# Patient Record
Sex: Female | Born: 1975 | Race: White | Hispanic: No | Smoking: Never smoker
Health system: Southern US, Community
[De-identification: ages and names within clinical notes are randomized; demographics above are authoritative.]

## PROBLEM LIST (undated history)

## (undated) DIAGNOSIS — F329 Major depressive disorder, single episode, unspecified: Secondary | ICD-10-CM

## (undated) DIAGNOSIS — G43909 Migraine, unspecified, not intractable, without status migrainosus: Secondary | ICD-10-CM

## (undated) DIAGNOSIS — K219 Gastro-esophageal reflux disease without esophagitis: Secondary | ICD-10-CM

## (undated) DIAGNOSIS — T7840XA Allergy, unspecified, initial encounter: Secondary | ICD-10-CM

## (undated) DIAGNOSIS — IMO0002 Reserved for concepts with insufficient information to code with codable children: Secondary | ICD-10-CM

## (undated) DIAGNOSIS — F32A Depression, unspecified: Secondary | ICD-10-CM

## (undated) DIAGNOSIS — Z8619 Personal history of other infectious and parasitic diseases: Secondary | ICD-10-CM

## (undated) HISTORY — DX: Depression, unspecified: F32.A

## (undated) HISTORY — DX: Migraine, unspecified, not intractable, without status migrainosus: G43.909

## (undated) HISTORY — DX: Gastro-esophageal reflux disease without esophagitis: K21.9

## (undated) HISTORY — DX: Personal history of other infectious and parasitic diseases: Z86.19

## (undated) HISTORY — DX: Allergy, unspecified, initial encounter: T78.40XA

## (undated) HISTORY — DX: Major depressive disorder, single episode, unspecified: F32.9

---

## 1997-10-03 DIAGNOSIS — R87619 Unspecified abnormal cytological findings in specimens from cervix uteri: Secondary | ICD-10-CM

## 1997-10-03 DIAGNOSIS — IMO0002 Reserved for concepts with insufficient information to code with codable children: Secondary | ICD-10-CM

## 1997-10-03 HISTORY — DX: Unspecified abnormal cytological findings in specimens from cervix uteri: R87.619

## 1997-10-03 HISTORY — DX: Reserved for concepts with insufficient information to code with codable children: IMO0002

## 2003-10-04 HISTORY — PX: DILATION AND CURETTAGE OF UTERUS: SHX78

## 2006-12-05 ENCOUNTER — Encounter: Payer: Self-pay | Admitting: Family Medicine

## 2006-12-05 ENCOUNTER — Ambulatory Visit: Payer: Self-pay | Admitting: Family Medicine

## 2006-12-26 ENCOUNTER — Ambulatory Visit (HOSPITAL_COMMUNITY): Admission: RE | Admit: 2006-12-26 | Discharge: 2006-12-26 | Payer: Self-pay | Admitting: Gynecology

## 2007-01-16 ENCOUNTER — Ambulatory Visit: Payer: Self-pay | Admitting: Family Medicine

## 2007-06-05 ENCOUNTER — Ambulatory Visit: Payer: Self-pay | Admitting: Family Medicine

## 2007-07-27 ENCOUNTER — Ambulatory Visit: Payer: Self-pay | Admitting: Family Medicine

## 2008-02-12 ENCOUNTER — Ambulatory Visit: Payer: Self-pay | Admitting: Family Medicine

## 2008-02-14 ENCOUNTER — Ambulatory Visit: Payer: Self-pay | Admitting: Gynecology

## 2008-02-26 ENCOUNTER — Ambulatory Visit: Payer: Self-pay | Admitting: Family Medicine

## 2008-02-28 ENCOUNTER — Ambulatory Visit (HOSPITAL_COMMUNITY): Admission: RE | Admit: 2008-02-28 | Discharge: 2008-02-28 | Payer: Self-pay | Admitting: Family Medicine

## 2008-04-08 ENCOUNTER — Encounter: Payer: Self-pay | Admitting: Obstetrics and Gynecology

## 2008-04-08 ENCOUNTER — Ambulatory Visit: Payer: Self-pay | Admitting: Obstetrics and Gynecology

## 2008-05-01 ENCOUNTER — Ambulatory Visit: Payer: Self-pay | Admitting: Gynecology

## 2008-05-16 ENCOUNTER — Ambulatory Visit (HOSPITAL_COMMUNITY): Admission: RE | Admit: 2008-05-16 | Discharge: 2008-05-16 | Payer: Self-pay | Admitting: Gynecology

## 2008-05-26 ENCOUNTER — Ambulatory Visit: Payer: Self-pay | Admitting: Obstetrics & Gynecology

## 2008-06-24 ENCOUNTER — Ambulatory Visit: Payer: Self-pay | Admitting: Obstetrics & Gynecology

## 2008-07-23 ENCOUNTER — Ambulatory Visit: Payer: Self-pay | Admitting: Obstetrics and Gynecology

## 2008-07-23 ENCOUNTER — Encounter: Payer: Self-pay | Admitting: Family Medicine

## 2008-07-23 LAB — CONVERTED CEMR LAB
Hemoglobin: 11.8 g/dL — ABNORMAL LOW (ref 12.0–15.0)
MCHC: 33.6 g/dL (ref 30.0–36.0)
MCV: 90.5 fL (ref 78.0–100.0)
Platelets: 200 10*3/uL (ref 150–400)
RDW: 13.7 % (ref 11.5–15.5)
WBC: 9.1 10*3/uL (ref 4.0–10.5)

## 2008-08-13 ENCOUNTER — Ambulatory Visit: Payer: Self-pay | Admitting: Obstetrics and Gynecology

## 2008-08-15 ENCOUNTER — Inpatient Hospital Stay (HOSPITAL_COMMUNITY): Admission: RE | Admit: 2008-08-15 | Discharge: 2008-08-15 | Payer: Self-pay | Admitting: Obstetrics & Gynecology

## 2008-08-15 ENCOUNTER — Ambulatory Visit: Payer: Self-pay | Admitting: Obstetrics and Gynecology

## 2008-08-18 ENCOUNTER — Ambulatory Visit: Payer: Self-pay | Admitting: Obstetrics and Gynecology

## 2008-08-18 ENCOUNTER — Encounter: Payer: Self-pay | Admitting: Family Medicine

## 2008-09-02 ENCOUNTER — Ambulatory Visit: Payer: Self-pay | Admitting: Family Medicine

## 2008-09-16 ENCOUNTER — Ambulatory Visit: Payer: Self-pay | Admitting: Obstetrics and Gynecology

## 2008-09-17 ENCOUNTER — Encounter: Payer: Self-pay | Admitting: Family Medicine

## 2008-09-23 ENCOUNTER — Ambulatory Visit: Payer: Self-pay | Admitting: Obstetrics & Gynecology

## 2008-09-30 ENCOUNTER — Ambulatory Visit: Payer: Self-pay | Admitting: Obstetrics and Gynecology

## 2008-10-07 ENCOUNTER — Ambulatory Visit: Payer: Self-pay | Admitting: Obstetrics and Gynecology

## 2008-10-09 ENCOUNTER — Ambulatory Visit: Payer: Self-pay | Admitting: Obstetrics & Gynecology

## 2008-10-12 ENCOUNTER — Ambulatory Visit: Payer: Self-pay | Admitting: Obstetrics and Gynecology

## 2008-10-12 ENCOUNTER — Inpatient Hospital Stay (HOSPITAL_COMMUNITY): Admission: AD | Admit: 2008-10-12 | Discharge: 2008-10-13 | Payer: Self-pay | Admitting: Obstetrics & Gynecology

## 2008-11-24 ENCOUNTER — Ambulatory Visit: Payer: Self-pay | Admitting: Obstetrics and Gynecology

## 2008-11-24 ENCOUNTER — Encounter: Payer: Self-pay | Admitting: Obstetrics and Gynecology

## 2008-11-24 ENCOUNTER — Encounter: Payer: Self-pay | Admitting: Family Medicine

## 2008-11-24 LAB — CONVERTED CEMR LAB
HCT: 39 % (ref 36.0–46.0)
MCHC: 33.3 g/dL (ref 30.0–36.0)
Platelets: 192 10*3/uL (ref 150–400)
RBC: 4.36 M/uL (ref 3.87–5.11)
WBC: 4.7 10*3/uL (ref 4.0–10.5)

## 2008-11-27 ENCOUNTER — Ambulatory Visit: Payer: Self-pay | Admitting: Family Medicine

## 2008-12-30 ENCOUNTER — Ambulatory Visit: Payer: Self-pay | Admitting: Obstetrics and Gynecology

## 2011-01-17 LAB — RPR: RPR Ser Ql: NONREACTIVE

## 2011-01-17 LAB — CBC
HCT: 38.7 % (ref 36.0–46.0)
Platelets: 176 10*3/uL (ref 150–400)
RBC: 4.16 MIL/uL (ref 3.87–5.11)
RDW: 14.1 % (ref 11.5–15.5)
WBC: 9.1 10*3/uL (ref 4.0–10.5)

## 2011-01-17 LAB — RH IMMUNE GLOB WKUP(>/=20WKS)(NOT WOMEN'S HOSP): Fetal Screen: NEGATIVE

## 2011-02-15 NOTE — Assessment & Plan Note (Signed)
Tricia, [name] NO.:  [id number]   MEDICAL RECORD NO.:  [id number]          PATIENT TYPE:  POB   LOCATION:  [facility] at [facility]         FACILITY:  [facility]   PHYSICIAN:  Argentina Donovan, MD        DATE OF BIRTH:  1976-02-26   DATE OF SERVICE:  12/30/2008                                  CLINIC NOTE   The patient comes in office today for a IUD check.  She had a Mirena IUD  inserted approximately 4 weeks ago on November 27, 2008, by Dr. Shawnie Pons.  She has not had any difficulty with her Mirena.  She has had some light  spotting.   PHYSICAL EXAMINATION:  GENERAL:  A well-developed, well-nourished 35-  year-old Caucasian female in no acute distress.  VITAL SIGNS:  Blood pressures is 85/58, pulse is 74, weight 136, height  is 5 feet 7.  GENITALIA:  Externally there are no lesions or discharges.  Internally  there is a very small amount of blood-colored discharge.  The cervical  os is without lesions.  The strings are present.  There is no cervical  motion tenderness or no adnexal mass.   ASSESSMENT:  Contraception.   PLAN:  Her IUD is in place and doing well.  She should not have any  difficulties with this.  If she does, she can certainly return to the  office on an as-needed basis.  Otherwise, we will see her back in March  2011 for her yearly exam.       Remonia Richter, NP    ______________________________  Argentina Donovan, MD    LR/MEDQ  D:  12/30/2008  T:  12/31/2008  Job:  [id number]

## 2011-02-15 NOTE — Assessment & Plan Note (Signed)
NAMENAHJAE, HOEG NO.:  [id number]   MEDICAL RECORD NO.:  [id number]          PATIENT TYPE:  POB   LOCATION:  [facility]         FACILITY:  [facility]   PHYSICIAN:  Tinnie Gens, MD        DATE OF BIRTH:  1976-02-05   DATE OF SERVICE:  11/27/2008                                  CLINIC NOTE   CHIEF COMPLAINT:  IUD insertion.   HISTORY OF PRESENT ILLNESS:  The patient is a 35 year old G1, P1 who is  status post vaginal delivery of a term infant who is doing well.  She is  in for postpartum check on November 24, 2008, and is back for an IUD  insertion today.  She has not had sex since her delivery.  She reports  her mood is well.  She is sleeping well.   On exam today, her vitals were as noted in the chart.  She is a well-  nourished female in no acute distress.  Soft, nontender, nondistended.   PROCEDURE:  Speculum was placed through the vagina.  Cervix was  visualized.  Painted with Betadine x2, grasped anteriorly with single-  tooth tenaculum, sound to  6.5 cm.  Mirena IUD was placed without  difficulty.  Strings were trimmed to 3 cm.   IMPRESSION:  Undesired fertility status post intrauterine device  insertion.   PLAN:  Follow up in 2-4 weeks for IUD check and string check.           ______________________________  Tinnie Gens, MD     TP/MEDQ  D:  11/27/2008  T:  11/28/2008  Job:  [id number]

## 2011-02-18 NOTE — Assessment & Plan Note (Signed)
NAMETOSHIBA, NULL NO.:  [id number]   MEDICAL RECORD NO.:  [id number]          PATIENT TYPE:  POB   LOCATION:  [facility]         FACILITY:  [facility]   PHYSICIAN:  Tinnie Gens, MD        DATE OF BIRTH:  14-May-1976   DATE OF SERVICE:  01/16/2007                                  CLINIC NOTE   CHIEF COMPLAINT:  Followup for infertility.   HISTORY OF PRESENT ILLNESS:  Patient is a 35 year old gravida 1, para 0-  0-1-0, had SAB for __________  approximately 3-1/2 years ago.  Since  that time she has had difficulty attaining pregnancy.  They have been  trying for the last year.  She and her husband come in today for  followup.  She does have normal cycles.  She brings in a __________  body temperature charting, which does confirm ovulation.  She also  underwent HSG testing, which showed a small endometrial cavity but not  abnormal and has no fixed filling defects.  Both tubes have a normal  appearance, and there is prompt free spill of contrast into both adnexal  regions.  Additionally, her husband underwent semen analysis, which  revealed normal pH, normal motility, normal volume, and total sperm  count of 589 million with 38% normal form, which appears normal to me.  The patient is not interested in infertility referral at this moment.  She will try again for the next six months and see how things are going  for her.  She may be ready for referral at that point.  The patient had  discussion with __________  regarding some shared cost __________  with  the repro __________  in [place] or referral to [facility].  The patient is  probably wanting to go with something short of __________  ovulation  plus IUI.   The patient will continue daily body temperature charting and timed  intercourse as best they can.  She will follow up in six months.           ______________________________  Tinnie Gens, MD     TP/MEDQ  D:  01/16/2007  T:  01/16/2007  Job:   [id number]

## 2011-02-18 NOTE — Assessment & Plan Note (Signed)
NAME:  Tricia [name], Tricia [name] NO.:  [id number]   MEDICAL RECORD NO.:  [id number]          PATIENT TYPE:  POB   LOCATION:  [facility]         FACILITY:  [facility]   PHYSICIAN:  Tinnie Gens, MD        DATE OF BIRTH:  [birth year]   DATE OF SERVICE:                                  CLINIC NOTE   CHIEF COMPLAINT:  Yearly exam.   HISTORY OF PRESENT ILLNESS:  Patient is a 35 year old, gravida 1, para 0-  0-1-0 who had a SAB 3-1/2 years ago with __________ in [place] and a  D&C following this. Since that time she has gotten married and would  like to achieve pregnancy. She has been trying for approximately 1 year.  Her husband has no children. She has regular cycles that are monthly.  She has been on prenatal vitamins and folic acid all this time. Her  cycles are approximately 25 to 27 days and are light in flow. Otherwise  she is without complaint.   PAST MEDICAL HISTORY:  Negative except for migraine headaches.   PAST SURGICAL HISTORY:  D&C.   MEDICATIONS:  1. Prenatal vitamins 1 p.o. daily.  2. Folic acid 1 p.o. daily.   ALLERGIES:  TO SULFA.   GYNECOLOGICAL HISTORY:  Menarche at age 6, cycles every 25 to 27 days.  No pain with her periods, no history of STD, gonorrhea, Chlamydia, or  PID. She does have a history of abnormal pap in 2000 that was followed  with a repeat pap. She has had no surgery on her cervix. OB history is  G1, P0 with 1 SAB at 10 weeks with __________ diagnosed approximately 3-  1/2 years ago.   FAMILY HISTORY:  Hypertension. She has rheumatoid arthritis in her  sister.   SOCIAL HISTORY:  She is a Runner, broadcasting/film/video for third grade in [employer],  no tobacco. She drinks approximately 2 alcoholic beverages per week. No  other drugs. A 14-point review of systems is reviewed, please see  __________ in the chart.   PHYSICAL EXAMINATION:  VITAL SIGNS:  Her weight is 136, blood pressure  97/60.  GENERAL:  She is a well-developed,  well-nourished female in no acute  distress.  LUNGS: Clear bilaterally.  CV: Regular rate and rhythm, no rubs, gallops, or murmurs.  NECK: Supple, normal thyroid.  BREAST: Symmetric with everted nipples, no masses, no supraclavicular,  axillary adenopathy.  ABDOMEN: Soft, nontender, nondistended.  EXTREMITIES: No cyanosis, clubbing, or edema, 2+ distal pulses.  GU: Normal external female genitalia, BUS is normal, vagina is pink and  rugae.  Cervix normal appearance and without lesion.  Uterus small  anteverted, located to the patient's left, nontender, no adnexal mass,  or tenderness.   IMPRESSION:  1. Yearly exam.  2. Infertility.   PLAN:  1. Pap smear today.  2. Reviewed self breast exams.  3. Offered the patient flu shot but she declined.  4. The 3 main causes of infertility including no ovulation unlikely      given regular cycles, blockage of tube or ovary unlikely.  Given      patient's history, could be some mild Asherman's.  Will  check HSG      and female problem.  Will check semen analysis.  5. Will follow up in 6-8 weeks to see results of test and further      referrals as indicated.           ______________________________  Tinnie Gens, MD     TP/MEDQ  D:  12/05/2006  T:  12/05/2006  Job:  [id number]

## 2011-07-05 LAB — URINALYSIS, ROUTINE W REFLEX MICROSCOPIC
Nitrite: NEGATIVE
Urobilinogen, UA: 0.2

## 2011-08-10 LAB — OB RESULTS CONSOLE ANTIBODY SCREEN: Antibody Screen: NEGATIVE

## 2011-08-10 LAB — OB RESULTS CONSOLE ABO/RH: RH Type: NEGATIVE

## 2011-09-06 LAB — OB RESULTS CONSOLE GC/CHLAMYDIA
Chlamydia: NEGATIVE
Gonorrhea: NEGATIVE

## 2011-10-04 NOTE — L&D Delivery Note (Signed)
Delivery Note At 12:20 PM a viable and healthy female was delivered via  (Presentation:LOA with [place] x one reduced ).  APGAR: 8,9 ; weight  pending.   Placenta status:  Spontaneous and intact with 3 vessel Cord:  with the following complications: none.  Cord pH: na  Anesthesia:  epidural Episiotomy: none Lacerations: none Suture Repair: none Est. Blood Loss (mL): 300  Mom to postpartum.  Baby to nursery-stable.  Tricia [name] 03/05/2012, 12:31 PM

## 2012-02-15 LAB — OB RESULTS CONSOLE GBS: GBS: NEGATIVE

## 2012-03-04 ENCOUNTER — Inpatient Hospital Stay (HOSPITAL_COMMUNITY)
Admission: AD | Admit: 2012-03-04 | Discharge: 2012-03-06 | DRG: 373 | Disposition: A | Discharge status: Home / Self Care | Payer: BC Managed Care – PPO | Source: Ambulatory Visit | Attending: Obstetrics and Gynecology | Admitting: Obstetrics and Gynecology

## 2012-03-04 ENCOUNTER — Encounter (HOSPITAL_COMMUNITY): Payer: Self-pay | Admitting: Obstetrics and Gynecology

## 2012-03-04 DIAGNOSIS — D62 Acute posthemorrhagic anemia: Secondary | ICD-10-CM | POA: Diagnosis not present

## 2012-03-04 DIAGNOSIS — O09529 Supervision of elderly multigravida, unspecified trimester: Secondary | ICD-10-CM | POA: Diagnosis present

## 2012-03-04 DIAGNOSIS — O9903 Anemia complicating the puerperium: Principal | ICD-10-CM | POA: Diagnosis not present

## 2012-03-04 HISTORY — DX: Migraine, unspecified, not intractable, without status migrainosus: G43.909

## 2012-03-04 HISTORY — DX: Reserved for concepts with insufficient information to code with codable children: IMO0002

## 2012-03-04 LAB — CBC
Hemoglobin: 10.5 g/dL — ABNORMAL LOW (ref 12.0–15.0)
MCH: 28.8 pg (ref 26.0–34.0)
MCHC: 33.2 g/dL (ref 30.0–36.0)
Platelets: 170 10*3/uL (ref 150–400)
RDW: 14.3 % (ref 11.5–15.5)

## 2012-03-04 MED ORDER — ACETAMINOPHEN 325 MG PO TABS: 650.0000 mg | ORAL_TABLET | ORAL | Status: DC | PRN

## 2012-03-04 MED ORDER — CITRIC ACID-SODIUM CITRATE 334-500 MG/5ML PO SOLN: 30.0000 mL | ORAL | Status: DC | PRN

## 2012-03-04 MED ORDER — LACTATED RINGERS IV SOLN
INTRAVENOUS | Status: DC
  Administered 2012-03-04 – 2012-03-05 (×2): via INTRAVENOUS

## 2012-03-04 MED ORDER — OXYCODONE-ACETAMINOPHEN 5-325 MG PO TABS: 1.0000 | ORAL_TABLET | ORAL | Status: DC | PRN

## 2012-03-04 MED ORDER — OXYTOCIN BOLUS FROM INFUSION
500.0000 mL | Freq: Once | INTRAVENOUS | Status: DC
  Filled 2012-03-04: qty 500

## 2012-03-04 MED ORDER — FLEET ENEMA 7-19 GM/118ML RE ENEM: 1.0000 | ENEMA | RECTAL | Status: DC | PRN

## 2012-03-04 MED ORDER — OXYTOCIN 20 UNITS IN LACTATED RINGERS INFUSION - SIMPLE
125.0000 mL/h | Freq: Once | INTRAVENOUS | Status: AC
  Administered 2012-03-05: 125 mL/h via INTRAVENOUS

## 2012-03-04 MED ORDER — ZOLPIDEM TARTRATE 5 MG PO TABS
5.0000 mg | ORAL_TABLET | Freq: Every evening | ORAL | Status: DC | PRN
  Administered 2012-03-04: 5 mg via ORAL
  Filled 2012-03-04: qty 1

## 2012-03-04 MED ORDER — LIDOCAINE HCL (PF) 1 % IJ SOLN
30.0000 mL | INTRAMUSCULAR | Status: DC | PRN
  Filled 2012-03-04: qty 30

## 2012-03-04 MED ORDER — LACTATED RINGERS IV SOLN
500.0000 mL | INTRAVENOUS | Status: DC | PRN
  Administered 2012-03-05: 500 mL via INTRAVENOUS

## 2012-03-04 MED ORDER — ONDANSETRON HCL 4 MG/2ML IJ SOLN: 4.0000 mg | Freq: Four times a day (QID) | INTRAMUSCULAR | Status: DC | PRN

## 2012-03-04 MED ORDER — IBUPROFEN 600 MG PO TABS: 600.0000 mg | ORAL_TABLET | Freq: Four times a day (QID) | ORAL | Status: DC | PRN

## 2012-03-04 NOTE — H&P (Signed)
[name], Tricia [name] NO.:  [id number]  MEDICAL RECORD NO.:  [id number]  LOCATION:  9164                          FACILITY:  [facility]  PHYSICIAN:  Lenoard Aden, M.D.DATE OF BIRTH:  Jun 22, 1976  DATE OF ADMISSION:  03/04/2012 DATE OF DISCHARGE:                             HISTORY & PHYSICAL   CHIEF COMPLAINT: Rupture of  membranes at 6 p.m.  She is a 36 year old white female, G3, P1 at 38-1/7th week gestation who presents with leakage of clear fluid at 6pm and irregular contractions, GBS negative, for re-evaluation and admission due to spontaneous rupture of membranes.  She has allergies to SULFA.  MEDICATIONS:  Prenatal vitamins and Claritin as needed.  She is a nonsmoker, nondrinker.  She denies domestic or physical violence.  41 week vaginal delivery in 2010, SAB in 2005.  FAMILY HISTORY:  Arthritis and chronic hypertension.  SOCIAL HISTORY:  Noncontributory.  PREVIOUS MEDICAL HISTORY:  Noncontributory.  PRENATAL COURSE:  Uncomplicated.  PHYSICAL EXAMINATION:  GENERAL:  She is a well-developed, well- nourished, white female, in no acute distress. HEENT:  Normal. NECK:  Supple.  Full range of motion. LUNGS:  Clear. HEART:  Regular rhythm. ABDOMEN:  Soft, gravid, and nontender.  Estimated fetal weight 6-1/2 to 7 pounds. SKIN:  Intact.  NST is reactive.   IMP: Spontaneous Rupture of membranes at term.  GBS negative.  PLAN:  Admit.  Expectant management.  Pitocin as needed.  Anticipate cautious attempts at vaginal delivery.     Lenoard Aden, M.D.     RJT/MEDQ  D:  03/04/2012  T:  03/04/2012  Job:  [id number]

## 2012-03-04 NOTE — MAU Note (Signed)
"  Around 1400 today I had a small leak.  Then another one small one around 1600.  Then a third bigger gush around 1830 that continued to leak.  Now everytime I change positions or move to walk, I leak more fluid.  The fluid was clear.  (+) FM, (-) GBS, no GDM or Hypertension."

## 2012-03-04 NOTE — Progress Notes (Signed)
MD stated that he wants to see if patient goes into labor.  If not will start pitocin in the morning.

## 2012-03-04 NOTE — Progress Notes (Signed)
Tricia [name] is a 36 y.o. G3P1011 at [redacted]w[redacted]d by LMP admitted for rupture of membranes  Subjective: Comfortable  Objective: Ht 5\' 7"  (1.702 m)  Wt 79.833 kg (176 lb)  BMI 27.57 kg/m2      FHT:  FHR: 145 bpm, variability: moderate,  accelerations:  Present,  decelerations:  Absent UC:   irregular, every 4-8 minutes SVE:   Dilation: 2.5 Effacement (%): 80;70 Station: -2 Exam by:: Raelyn Mora, RN Clear AF noted  Labs: pending  Assessment / Plan: SROM at term, GBS neg  Labor: Progressing normally Preeclampsia:  na Fetal Wellbeing:  Category I Pain Control:  Labor support without medications I/D:  n/a Anticipated MOD:  NSVD  [name] J 03/04/2012, 9:08 PM

## 2012-03-05 ENCOUNTER — Encounter (HOSPITAL_COMMUNITY): Payer: Self-pay | Admitting: Anesthesiology

## 2012-03-05 ENCOUNTER — Encounter (HOSPITAL_COMMUNITY): Payer: Self-pay | Admitting: *Deleted

## 2012-03-05 ENCOUNTER — Inpatient Hospital Stay (HOSPITAL_COMMUNITY): Payer: BC Managed Care – PPO | Admitting: Anesthesiology

## 2012-03-05 LAB — RPR: RPR Ser Ql: NONREACTIVE

## 2012-03-05 MED ORDER — ONDANSETRON HCL 4 MG PO TABS: 4.0000 mg | ORAL_TABLET | ORAL | Status: DC | PRN

## 2012-03-05 MED ORDER — DIPHENHYDRAMINE HCL 50 MG/ML IJ SOLN: 12.5000 mg | INTRAMUSCULAR | Status: DC | PRN

## 2012-03-05 MED ORDER — METHYLERGONOVINE MALEATE 0.2 MG/ML IJ SOLN: 0.2000 mg | INTRAMUSCULAR | Status: DC | PRN

## 2012-03-05 MED ORDER — EPHEDRINE 5 MG/ML INJ: 10.0000 mg | INTRAVENOUS | Status: DC | PRN

## 2012-03-05 MED ORDER — PHENYLEPHRINE 40 MCG/ML (10ML) SYRINGE FOR IV PUSH (FOR BLOOD PRESSURE SUPPORT)
80.0000 ug | PREFILLED_SYRINGE | INTRAVENOUS | Status: DC | PRN
  Filled 2012-03-05: qty 5

## 2012-03-05 MED ORDER — TETANUS-DIPHTH-ACELL PERTUSSIS 5-2.5-18.5 LF-MCG/0.5 IM SUSP: 0.5000 mL | Freq: Once | INTRAMUSCULAR | Status: DC

## 2012-03-05 MED ORDER — FENTANYL 2.5 MCG/ML BUPIVACAINE 1/10 % EPIDURAL INFUSION (WH - ANES)
INTRAMUSCULAR | Status: DC | PRN
  Administered 2012-03-05: 14 mL/h via EPIDURAL

## 2012-03-05 MED ORDER — EPHEDRINE 5 MG/ML INJ
10.0000 mg | INTRAVENOUS | Status: DC | PRN
  Filled 2012-03-05: qty 4

## 2012-03-05 MED ORDER — PHENYLEPHRINE 40 MCG/ML (10ML) SYRINGE FOR IV PUSH (FOR BLOOD PRESSURE SUPPORT): 80.0000 ug | PREFILLED_SYRINGE | INTRAVENOUS | Status: DC | PRN

## 2012-03-05 MED ORDER — BENZOCAINE-MENTHOL 20-0.5 % EX AERO: 1.0000 "application " | INHALATION_SPRAY | CUTANEOUS | Status: DC | PRN

## 2012-03-05 MED ORDER — LIDOCAINE HCL (PF) 1 % IJ SOLN
INTRAMUSCULAR | Status: DC | PRN
  Administered 2012-03-05: 4 mL

## 2012-03-05 MED ORDER — TERBUTALINE SULFATE 1 MG/ML IJ SOLN: 0.2500 mg | Freq: Once | INTRAMUSCULAR | Status: DC | PRN

## 2012-03-05 MED ORDER — DIBUCAINE 1 % RE OINT: 1.0000 "application " | TOPICAL_OINTMENT | RECTAL | Status: DC | PRN

## 2012-03-05 MED ORDER — OXYTOCIN 20 UNITS IN LACTATED RINGERS INFUSION - SIMPLE: 125.0000 mL/h | INTRAVENOUS | Status: DC

## 2012-03-05 MED ORDER — LANOLIN HYDROUS EX OINT: TOPICAL_OINTMENT | CUTANEOUS | Status: DC | PRN

## 2012-03-05 MED ORDER — OXYCODONE-ACETAMINOPHEN 5-325 MG PO TABS: 1.0000 | ORAL_TABLET | ORAL | Status: DC | PRN

## 2012-03-05 MED ORDER — IBUPROFEN 600 MG PO TABS
600.0000 mg | ORAL_TABLET | Freq: Four times a day (QID) | ORAL | Status: DC
  Administered 2012-03-05 – 2012-03-06 (×4): 600 mg via ORAL
  Filled 2012-03-05 (×4): qty 1

## 2012-03-05 MED ORDER — PRENATAL MULTIVITAMIN CH
1.0000 | ORAL_TABLET | Freq: Every day | ORAL | Status: DC
  Administered 2012-03-06: 1 via ORAL
  Filled 2012-03-05: qty 1

## 2012-03-05 MED ORDER — SIMETHICONE 80 MG PO CHEW: 80.0000 mg | CHEWABLE_TABLET | ORAL | Status: DC | PRN

## 2012-03-05 MED ORDER — LACTATED RINGERS IV SOLN
500.0000 mL | Freq: Once | INTRAVENOUS | Status: AC
  Administered 2012-03-05: 500 mL via INTRAVENOUS

## 2012-03-05 MED ORDER — SENNOSIDES-DOCUSATE SODIUM 8.6-50 MG PO TABS
2.0000 | ORAL_TABLET | Freq: Every day | ORAL | Status: DC
  Administered 2012-03-05: 2 via ORAL

## 2012-03-05 MED ORDER — ONDANSETRON HCL 4 MG/2ML IJ SOLN: 4.0000 mg | INTRAMUSCULAR | Status: DC | PRN

## 2012-03-05 MED ORDER — ZOLPIDEM TARTRATE 5 MG PO TABS: 5.0000 mg | ORAL_TABLET | Freq: Every evening | ORAL | Status: DC | PRN

## 2012-03-05 MED ORDER — DIPHENHYDRAMINE HCL 25 MG PO CAPS: 25.0000 mg | ORAL_CAPSULE | Freq: Four times a day (QID) | ORAL | Status: DC | PRN

## 2012-03-05 MED ORDER — OXYTOCIN 20 UNITS IN LACTATED RINGERS INFUSION - SIMPLE
1.0000 m[IU]/min | INTRAVENOUS | Status: DC
  Administered 2012-03-05: 2 m[IU]/min via INTRAVENOUS
  Filled 2012-03-05: qty 1000

## 2012-03-05 MED ORDER — METHYLERGONOVINE MALEATE 0.2 MG PO TABS: 0.2000 mg | ORAL_TABLET | ORAL | Status: DC | PRN

## 2012-03-05 MED ORDER — FENTANYL 2.5 MCG/ML BUPIVACAINE 1/10 % EPIDURAL INFUSION (WH - ANES)
14.0000 mL/h | INTRAMUSCULAR | Status: DC
  Filled 2012-03-05: qty 60

## 2012-03-05 MED ORDER — WITCH HAZEL-GLYCERIN EX PADS: 1.0000 "application " | MEDICATED_PAD | CUTANEOUS | Status: DC | PRN

## 2012-03-05 NOTE — Progress Notes (Signed)
Tricia [name] is a 36 y.o. G3P1011 at 106w2d by LMP admitted for rupture of membranes  Subjective: comfortable  Objective: BP 115/65  Pulse 80  Temp(Src) 97.9 F (36.6 C) (Oral)  Resp 18  Ht 5\' 7"  (1.702 m)  Wt 79.833 kg (176 lb)  BMI 27.57 kg/m2      FHT:  FHR: 155 bpm, variability: moderate,  accelerations:  Present,  decelerations:  Absent UC:   irregular, every 4-6 minutes SVE:   Dilation: 2.5 Effacement (%): 70 Station: -2 Exam by:: H.Norton, RN   Labs: Lab Results  Component Value Date   WBC 9.5 03/04/2012   HGB 10.5* 03/04/2012   HCT 31.6* 03/04/2012   MCV 86.6 03/04/2012   PLT 170 03/04/2012    Assessment / Plan: Augmentation of labor, progressing well  Labor: Progressing normally Preeclampsia:  na Fetal Wellbeing:  Category I Pain Control:  Labor support without medications I/D:  n/a Anticipated MOD:  NSVD  Tricia [name] 03/05/2012, 7:03 AM

## 2012-03-05 NOTE — Anesthesia Preprocedure Evaluation (Signed)

## 2012-03-05 NOTE — Anesthesia Procedure Notes (Signed)
Epidural Patient location during procedure: OB Start time: 03/05/2012 10:02 AM  Staffing Anesthesiologist: Kaysey Berndt A. Performed by: anesthesiologist   Preanesthetic Checklist Completed: patient identified, site marked, surgical consent, pre-op evaluation, timeout performed, IV checked, risks and benefits discussed and monitors and equipment checked  Epidural Patient position: sitting Prep: site prepped and draped and DuraPrep Patient monitoring: continuous pulse ox and blood pressure Approach: midline Injection technique: LOR air  Needle:  Needle type: Tuohy  Needle gauge: 17 G Needle length: 9 cm Needle insertion depth: 5 cm cm Catheter type: closed end flexible Catheter size: 19 Gauge Catheter at skin depth: 10 cm Test dose: negative and Other  Assessment Events: blood not aspirated, injection not painful, no injection resistance, negative IV test and no paresthesia  Additional Notes Patient identified. Risks and benefits discussed including failed block, incomplete  Pain control, post dural puncture headache, nerve damage, paralysis, blood pressure Changes, nausea, vomiting, reactions to medications-both toxic and allergic and post Partum back pain. All questions were answered. Patient expressed understanding and wished to proceed. Sterile technique was used throughout procedure. Epidural site was Dressed with sterile barrier dressing. No paresthesias, signs of intravascular injection Or signs of intrathecal spread were encountered.  Patient was more comfortable after the epidural was dosed. Please see RN's note for documentation of vital signs and FHR which are stable.

## 2012-03-06 ENCOUNTER — Encounter (HOSPITAL_COMMUNITY): Payer: Self-pay | Admitting: Obstetrics and Gynecology

## 2012-03-06 LAB — CBC
HCT: 29.5 % — ABNORMAL LOW (ref 36.0–46.0)
MCHC: 32.2 g/dL (ref 30.0–36.0)
MCV: 88.3 fL (ref 78.0–100.0)
RDW: 14.3 % (ref 11.5–15.5)

## 2012-03-06 MED ORDER — RHO D IMMUNE GLOBULIN 1500 UNIT/2ML IJ SOLN
300.0000 ug | Freq: Once | INTRAMUSCULAR | Status: AC
  Administered 2012-03-06: 300 ug via INTRAMUSCULAR
  Filled 2012-03-06: qty 2

## 2012-03-06 MED ORDER — IBUPROFEN 600 MG PO TABS: 600.0000 mg | ORAL_TABLET | Freq: Four times a day (QID) | ORAL | Status: AC

## 2012-03-06 MED ORDER — DOCUSATE SODIUM 100 MG PO CAPS: 100.0000 mg | ORAL_CAPSULE | Freq: Two times a day (BID) | ORAL | Status: AC | PRN

## 2012-03-06 MED ORDER — FERRALET 90 90-1 MG PO TABS: 1.0000 | ORAL_TABLET | Freq: Every day | ORAL | Status: DC

## 2012-03-06 NOTE — Discharge Summary (Signed)
Obstetric Discharge Summary Reason for Admission: rupture of membranes and term gestation, labor augmentation Prenatal Procedures: ultrasound Intrapartum Procedures: spontaneous vaginal delivery Postpartum Procedures: Rho(D) Ig Complications-Operative and Postpartum: none Hemoglobin  Date Value Range Status  03/06/2012 9.5* 12.0-15.0 (g/dL) Final     HCT  Date Value Range Status  03/06/2012 29.5* 36.0-46.0 (%) Final    Physical Exam:  General: alert, cooperative and no distress Lochia: appropriate Uterine Fundus: firm Incision: n/a DVT Evaluation: No evidence of DVT seen on physical exam.  Discharge Diagnoses: Term Pregnancy-delivered and Mild ABL Anemia  Discharge Information: Date: 03/06/2012 Activity: pelvic rest Diet: routine Medications: Ibuprofen, Colace and Iron Condition: stable Instructions: refer to practice specific booklet Discharge to: home   Newborn Data: Live born female on 03/05/12 Birth Weight: 6 lb 15.1 oz (3150 g) APGAR: 8, 9  Home with mother.  Emmett Bracknell K 03/06/2012, 9:50 AM

## 2012-03-06 NOTE — Plan of Care (Signed)
Problem: Discharge Progression Outcomes Goal: MMR given as ordered Outcome: Not Met (add Reason) Not ordered

## 2012-03-06 NOTE — Progress Notes (Signed)
Patient ID: Tricia [name], female   DOB: 13-May-1976, 36 y.o.   MRN: [id number]  PPD # 1  Subjective: Pt reports feeling well and eager for early d/c home today/ Pain controlled with prescription NSAID's including ibuprofen Tolerating po/ Voiding without problems/ No n/v Bleeding is moderate Newborn info:  Information for the patient's newborn:  Ardene, Remley [[id number]]  female  / circ today/ Feeding: breast   Objective:  VS: Blood pressure 97/62, pulse 89, temperature 97.9 F (36.6 C), temperature source Oral, resp. rate 18    Basename 03/06/12 0535 03/04/12 2114  WBC 10.5 9.5  HGB 9.5* 10.5*  HCT 29.5* 31.6*  PLT 156 170    Blood type: A/Negative/-- (11/07 0000) (Infant Rh Pos) Rubella: Equivocal (11/07 0000)    Physical Exam:  General: A & O x 3  alert, cooperative and no distress CV: Regular rate and rhythm Resp: clear Abdomen: soft, nontender, normal bowel sounds Uterine Fundus: firm, below umbilicus, nontender Perineum: intact and mild erythema; ice pack in place Lochia: moderate Ext: edema +1 to +2 pedal and pretib and Homans sign is negative, no sign of DVT   A/P: PPD # 1/ G3P2012/ S/P:spontaneous vaginal delivery ABL Anemia Stable for early d/c home if peds clears infant RX: Ibuprofen 600mg  po Q 6 hrs prn pain #30 Refill x 1 Ferralet 90 po QD #30 Ref x 1 Colace 100mg  1 to 2 po QD prn #30 Ref x 1 Rt pp visit in 6 weeks Rhogam before d/c   Demetrius Revel, MSN, [facility] 03/06/2012, 9:08 AM

## 2012-03-07 LAB — RH IG WORKUP (INCLUDES ABO/RH)
ABO/RH(D): A NEG
DAT, IgG: NEGATIVE
Unit division: 0

## 2012-03-07 NOTE — Anesthesia Postprocedure Evaluation (Signed)
  Anesthesia Post-op Note  Patient: Tricia [name]  Procedure(s) Performed: *Lumbar epidural for l&d *  Complications: No apparent anesthesia complications

## 2014-08-04 ENCOUNTER — Encounter (HOSPITAL_COMMUNITY): Payer: Self-pay | Admitting: Obstetrics and Gynecology

## 2015-01-01 ENCOUNTER — Emergency Department
Admit: 2015-01-01 | Disposition: A | Discharge status: Home / Self Care | Payer: Self-pay | Admitting: Emergency Medicine

## 2018-07-18 LAB — HM MAMMOGRAPHY

## 2018-07-18 LAB — HM PAP SMEAR: HM Pap smear: NEGATIVE

## 2018-11-13 ENCOUNTER — Encounter: Payer: Self-pay | Admitting: Podiatry

## 2018-11-13 ENCOUNTER — Ambulatory Visit: Payer: BC Managed Care – PPO | Admitting: Podiatry

## 2018-11-13 ENCOUNTER — Ambulatory Visit (INDEPENDENT_AMBULATORY_CARE_PROVIDER_SITE_OTHER): Payer: BC Managed Care – PPO

## 2018-11-13 VITALS — BP 117/80 | HR 67

## 2018-11-13 DIAGNOSIS — M2580 Other specified joint disorders, unspecified joint: Secondary | ICD-10-CM | POA: Diagnosis not present

## 2018-11-13 DIAGNOSIS — M2012 Hallux valgus (acquired), left foot: Secondary | ICD-10-CM

## 2018-11-13 MED ORDER — MELOXICAM 15 MG PO TABS: 15.0000 mg | ORAL_TABLET | Freq: Every day | ORAL | 1 refills | Status: AC

## 2018-11-18 NOTE — Progress Notes (Signed)
   Subjective: 43 year old female presenting today as a new patient with a chief complaint of pain to the medial left hallux that has been ongoing for the past few weeks. Wearing shoes and walking excessively increases the pain. She has not done anything for treatment. Patient is here for further evaluation and treatment.  Past Medical History:  Diagnosis Date  . Abnormal Pap smear 1999  . Migraines   . PP SVD 03/05/12 M 03/06/2012      Objective: Physical Exam General: The patient is alert and oriented x3 in no acute distress.  Dermatology: Skin is cool, dry and supple bilateral lower extremities. Negative for open lesions or macerations.  Vascular: Palpable pedal pulses bilaterally. No edema or erythema noted. Capillary refill within normal limits.  Neurological: Epicritic and protective threshold grossly intact bilaterally.   Musculoskeletal Exam: Clinical evidence of bunion deformity noted to the respective foot. There is moderate pain on palpation range of motion of the first MPJ. Lateral deviation of the hallux noted consistent with hallux abductovalgus. Pain with palpation to the left fibular sesamoid apparatus.  Radiographic Exam: Increased intermetatarsal angle greater than 15 with a hallux abductus angle greater than 30 noted on AP view. Moderate degenerative changes noted within the first MPJ.  Assessment: 1. 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 Patient was evaluated. X-Rays reviewed. 2. Declined injections.  3. Prescription for Meloxicam provided to patient. 4. Offloading Dancer's pads dispensed.  5. Return to clinic as needed.   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    Edrick Kins, DPM [facility]  Dr. Edrick Kins, [address]                                        [place], [place] [address]                Office [phone]  Fax [phone]

## 2018-11-27 ENCOUNTER — Ambulatory Visit: Payer: BC Managed Care – PPO | Admitting: Family Medicine

## 2018-11-27 ENCOUNTER — Encounter: Payer: Self-pay | Admitting: Family Medicine

## 2018-11-27 VITALS — BP 100/62 | HR 62 | Temp 98.0°F | Ht 65.75 in | Wt 147.5 lb

## 2018-11-27 DIAGNOSIS — R1901 Right upper quadrant abdominal swelling, mass and lump: Secondary | ICD-10-CM | POA: Insufficient documentation

## 2018-11-27 DIAGNOSIS — J309 Allergic rhinitis, unspecified: Secondary | ICD-10-CM | POA: Diagnosis not present

## 2018-11-27 DIAGNOSIS — F324 Major depressive disorder, single episode, in partial remission: Secondary | ICD-10-CM | POA: Diagnosis not present

## 2018-11-27 DIAGNOSIS — G43109 Migraine with aura, not intractable, without status migrainosus: Secondary | ICD-10-CM

## 2018-11-27 DIAGNOSIS — F339 Major depressive disorder, recurrent, unspecified: Secondary | ICD-10-CM | POA: Insufficient documentation

## 2018-11-27 DIAGNOSIS — G43909 Migraine, unspecified, not intractable, without status migrainosus: Secondary | ICD-10-CM | POA: Insufficient documentation

## 2018-11-27 MED ORDER — FLUTICASONE PROPIONATE 50 MCG/ACT NA SUSP: 1.0000 | Freq: Every day | NASAL | 1 refills | Status: DC

## 2018-11-27 MED ORDER — ESCITALOPRAM OXALATE 10 MG PO TABS: 10.0000 mg | ORAL_TABLET | Freq: Every day | ORAL | 2 refills | Status: DC

## 2018-11-27 NOTE — Patient Instructions (Addendum)
#  Depression  - increase the Lexapro to 10 mg daily - return in 6 weeks  #Migraines - Try to keep track of how often  - return if you want to discuss preventative medication  #Mass - we will start with an ultrasound to get a better look at what this could be

## 2018-11-27 NOTE — Assessment & Plan Note (Signed)
Etiology unclear. Seems superficial to the muscle layer but not entirely clear. Small and long in size. [place] to further evaluate

## 2018-11-27 NOTE — Assessment & Plan Note (Addendum)
OTC medication currently. Did not tolerate tryptan 2/2 to excessive sleepiness. Could consider topiramate if recurrent.

## 2018-11-27 NOTE — Assessment & Plan Note (Signed)
Having a difficult year. Overall improved since starting escitalopram but is doing worse. Interested in increasing dose which is appropriate.

## 2018-11-27 NOTE — Progress Notes (Signed)
Subjective:     Tricia [name] is a 43 y.o. female presenting for Establish Care (has seen Dr. Bufford Lope. Has not had a PCP in years.) and Mass (x 1 year. Located on the right side bottom of rib cage. Has gotten bigger. Some soreness in the last 2 weeks.)     HPI   #Mass - noticed it 1 year ago - the right side of the rib cage - increasing in size - sore over the last 2 weeks - whole area - firm - difficulty getting comfortable on that side 2 nights ago - no redness or skin changes - not worse with movement - at least doubled in size since first noticing it - feels like something is stuck in her rib cage  #depression - this year has been hard - lexapro currently x 2.5 years - currently in therapy  - on a low dose - did get to good place with starting medicatoin  #migraines - increasing - using over the counter migraine medication - aleve is not improving - works through - was on Imitrex most recently - but this puts her to sleep - diagnosed as child - 4 years - shifted with hormones - no change with eating  #birth control - mirena - about 2-3 years  - no impact on migraines  Review of Systems  Constitutional: Negative for chills and fever.  Gastrointestinal: Negative for abdominal pain, nausea and vomiting.     Social History   Tobacco Use  Smoking Status Never Smoker  Smokeless Tobacco Never Used        Objective:    BP Readings from Last 3 Encounters:  11/27/18 100/62  11/13/18 117/80  03/06/12 97/62   Wt Readings from Last 3 Encounters:  11/27/18 147 lb 8 oz (66.9 kg)  03/04/12 176 lb (79.8 kg)    BP 100/62   Pulse 62   Temp 98 F (36.7 C)   Ht 5' 5.75" (1.67 m)   Wt 147 lb 8 oz (66.9 kg)   BMI 23.99 kg/m    Physical Exam Constitutional:      General: She is not in acute distress.    Appearance: She is well-developed. She is not diaphoretic.  HENT:     Right Ear: External ear normal.     Left Ear: External ear normal.   Nose: Nose normal.  Eyes:     Conjunctiva/sclera: Conjunctivae normal.  Neck:     Musculoskeletal: Neck supple.  Cardiovascular:     Rate and Rhythm: Normal rate and regular rhythm.     Heart sounds: No murmur.  Pulmonary:     Effort: Pulmonary effort is normal. No respiratory distress.     Breath sounds: Normal breath sounds. No wheezing.  Abdominal:     General: Abdomen is flat. Bowel sounds are normal. There is no distension.     Palpations: Abdomen is soft.     Tenderness: There is no abdominal tenderness.     Comments: RUQ just distal to the last rib is a small, firm mass. Immobile but feels below the surface of the skin and possibly in the muscle layer. It is irregularly shaped with a small circular top but then extending longer to the lateral abdomen  Skin:    General: Skin is warm and dry.     Capillary Refill: Capillary refill takes less than 2 seconds.  Neurological:     Mental Status: She is alert. Mental status is at baseline.  Psychiatric:  Mood and Affect: Mood normal.        Behavior: Behavior normal.      Depression screen [facility] 2/9 11/27/2018  Decreased Interest 0  Down, Depressed, Hopeless 0  PHQ - 2 Score 0  Altered sleeping 2  Tired, decreased energy 3  Change in appetite 0  Feeling bad or failure about yourself  1  Trouble concentrating 0  Moving slowly or fidgety/restless 0  Suicidal thoughts 0  PHQ-9 Score 6  Difficult doing work/chores Somewhat difficult        Assessment & Plan:   Problem List Items Addressed This Visit      Cardiovascular and Mediastinum   Migraines    OTC medication currently. Did not tolerate tryptan 2/2 to excessive sleepiness. Could consider topiramate if recurrent.       Relevant Medications   escitalopram (LEXAPRO) 10 MG tablet     Other   Major depressive disorder with single episode, in partial remission ([place])    Having a difficult year. Overall improved since starting escitalopram but is doing worse.  Interested in increasing dose which is appropriate.      Relevant Medications   escitalopram (LEXAPRO) 10 MG tablet   RUQ abdominal mass - Primary    Etiology unclear. Seems superficial to the muscle layer but not entirely clear. Small and long in size. [place] to further evaluate      Relevant Orders   US Abdomen Limited RUQ    Other Visit Diagnoses    Allergic rhinitis, unspecified seasonality, unspecified trigger       Relevant Medications   fluticasone (FLONASE) 50 MCG/ACT nasal spray       Return in about 6 weeks (around 01/08/2019).  Lesleigh Noe, MD

## 2018-12-03 ENCOUNTER — Telehealth: Payer: Self-pay

## 2018-12-03 ENCOUNTER — Ambulatory Visit
Admission: RE | Admit: 2018-12-03 | Discharge: 2018-12-03 | Disposition: A | Discharge status: Home / Self Care | Payer: BC Managed Care – PPO | Source: Ambulatory Visit | Attending: Family Medicine | Admitting: Family Medicine

## 2018-12-03 DIAGNOSIS — R1901 Right upper quadrant abdominal swelling, mass and lump: Secondary | ICD-10-CM

## 2018-12-03 NOTE — Telephone Encounter (Signed)
Left message for patient to call back about results.

## 2018-12-25 ENCOUNTER — Other Ambulatory Visit: Payer: Self-pay | Admitting: Family Medicine

## 2018-12-25 DIAGNOSIS — J309 Allergic rhinitis, unspecified: Secondary | ICD-10-CM

## 2018-12-25 DIAGNOSIS — F324 Major depressive disorder, single episode, in partial remission: Secondary | ICD-10-CM

## 2019-01-15 ENCOUNTER — Telehealth: Payer: Self-pay | Admitting: Family Medicine

## 2019-01-15 NOTE — Telephone Encounter (Signed)
Called to discuss possibility of making 4/16 appt a virtual visit. Lvm asking patient to call office.

## 2019-01-17 ENCOUNTER — Encounter: Payer: Self-pay | Admitting: Family Medicine

## 2019-01-17 ENCOUNTER — Ambulatory Visit (INDEPENDENT_AMBULATORY_CARE_PROVIDER_SITE_OTHER): Payer: BC Managed Care – PPO | Admitting: Family Medicine

## 2019-01-17 VITALS — HR 69 | Ht 65.75 in

## 2019-01-17 DIAGNOSIS — F324 Major depressive disorder, single episode, in partial remission: Secondary | ICD-10-CM

## 2019-01-17 DIAGNOSIS — F411 Generalized anxiety disorder: Secondary | ICD-10-CM

## 2019-01-17 DIAGNOSIS — F39 Unspecified mood [affective] disorder: Secondary | ICD-10-CM | POA: Insufficient documentation

## 2019-01-17 MED ORDER — ESCITALOPRAM OXALATE 10 MG PO TABS: 10.0000 mg | ORAL_TABLET | Freq: Every day | ORAL | 3 refills | Status: DC

## 2019-01-17 NOTE — Assessment & Plan Note (Signed)
Doing much better on current medication. Discussed remaining on for 1 year and checking in then. Also advised meditation practice to help with sleep and anxiety.

## 2019-01-17 NOTE — Progress Notes (Signed)
    I connected with Tricia [name] on 01/17/19 at 11:00 AM EDT by video and verified that I am speaking with the correct person using two identifiers.   I discussed the limitations, risks, security and privacy concerns of performing an evaluation and management service by video and the availability of in person appointments. I also discussed with the patient that there may be a patient responsible charge related to this service. The patient expressed understanding and agreed to proceed.  Patient location: Home Provider Location: [place] Participants: Lesleigh Noe and Tricia [name]   Subjective:     Tricia [name] is a 43 y.o. 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  - does have some intimacy issues, but managing ok  - sleep is still bad - used to do acupuncture and used a herb which helps with sleep when she cannot    Review of Systems No SI/HI - some sleep issues  Social History   Tobacco Use  Smoking Status Never Smoker  Smokeless Tobacco Never Used        Objective:   BP Readings from Last 3 Encounters:  11/27/18 100/62  11/13/18 117/80  03/06/12 97/62   Wt Readings from Last 3 Encounters:  11/27/18 147 lb 8 oz (66.9 kg)  03/04/12 176 lb (79.8 kg)    Pulse 69   Ht 5' 5.75" (1.67 m)   BMI 23.99 kg/m    Physical Exam Constitutional:      Appearance: Normal appearance.  HENT:     Head: Normocephalic and atraumatic.     Nose: Nose normal.  Eyes:     Conjunctiva/sclera: Conjunctivae normal.  Pulmonary:     Effort: Pulmonary effort is normal. No respiratory distress.  Neurological:     Mental Status: She is alert. Mental status is at baseline.  Psychiatric:        Mood and Affect: Mood normal.        Behavior: Behavior normal.        Thought Content: Thought content normal.        Judgment: Judgment  normal.      Depression screen [facility] 2/9 01/17/2019 11/27/2018  Decreased Interest 0 0  Down, Depressed, Hopeless 0 0  PHQ - 2 Score 0 0  Altered sleeping 1 2  Tired, decreased energy 1 3  Change in appetite 0 0  Feeling bad or failure about yourself  0 1  Trouble concentrating 0 0  Moving slowly or fidgety/restless 0 0  Suicidal thoughts 0 0  PHQ-9 Score 2 6  Difficult doing work/chores Not difficult at all Somewhat difficult           Assessment & Plan:   Problem List Items Addressed This Visit      Other   Major depressive disorder with single episode, in partial remission ([place])    Doing much better on current medication. Discussed remaining on for 1 year and checking in then. Also advised meditation practice to help with sleep and anxiety.       Relevant Medications   escitalopram (LEXAPRO) 10 MG tablet   Generalized anxiety disorder - Primary   Relevant Medications   escitalopram (LEXAPRO) 10 MG tablet       Return in about 1 year (around 01/17/2020).  Lesleigh Noe, MD

## 2019-01-22 ENCOUNTER — Other Ambulatory Visit: Payer: Self-pay | Admitting: Family Medicine

## 2019-01-22 DIAGNOSIS — J309 Allergic rhinitis, unspecified: Secondary | ICD-10-CM

## 2019-11-14 IMAGING — US US ABDOMEN LIMITED
1 series · 5 of 5 positions shown · non-contrast
Comparison: None.

CLINICAL DATA: Right upper quadrant palpable abnormality

EXAM:
ULTRASOUND ABDOMEN LIMITED

[Series 1: us abdomen limited · 0.09mm/px · 5 of 5 slices shown]
[im 1/5]
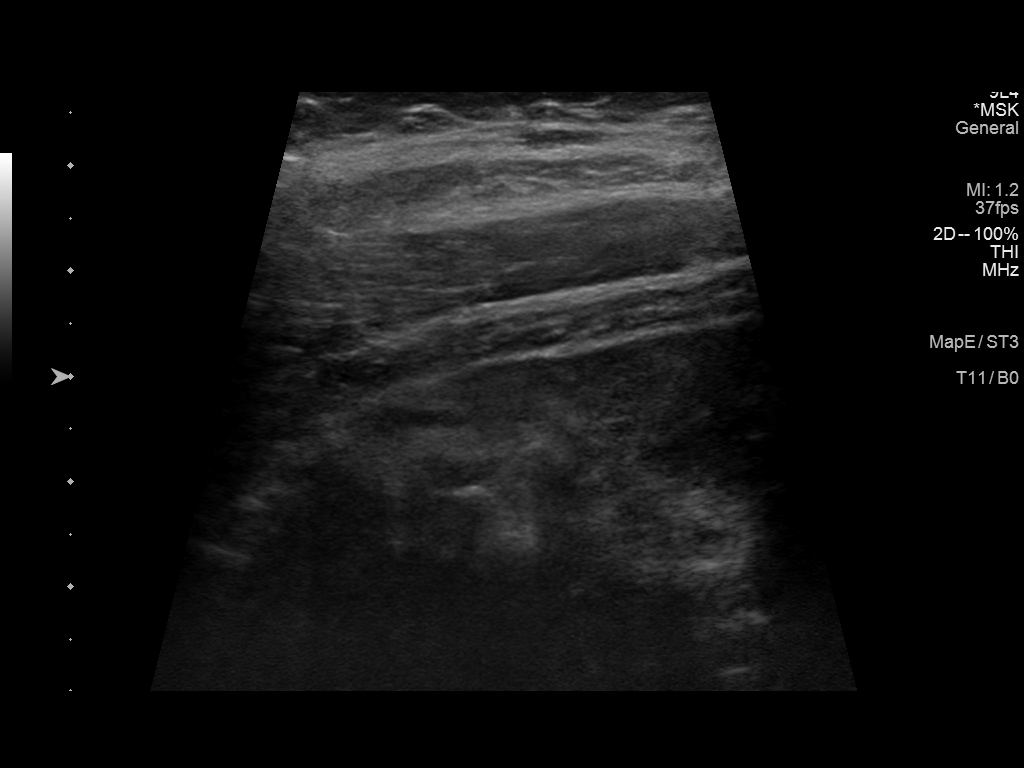
[im 2/5]
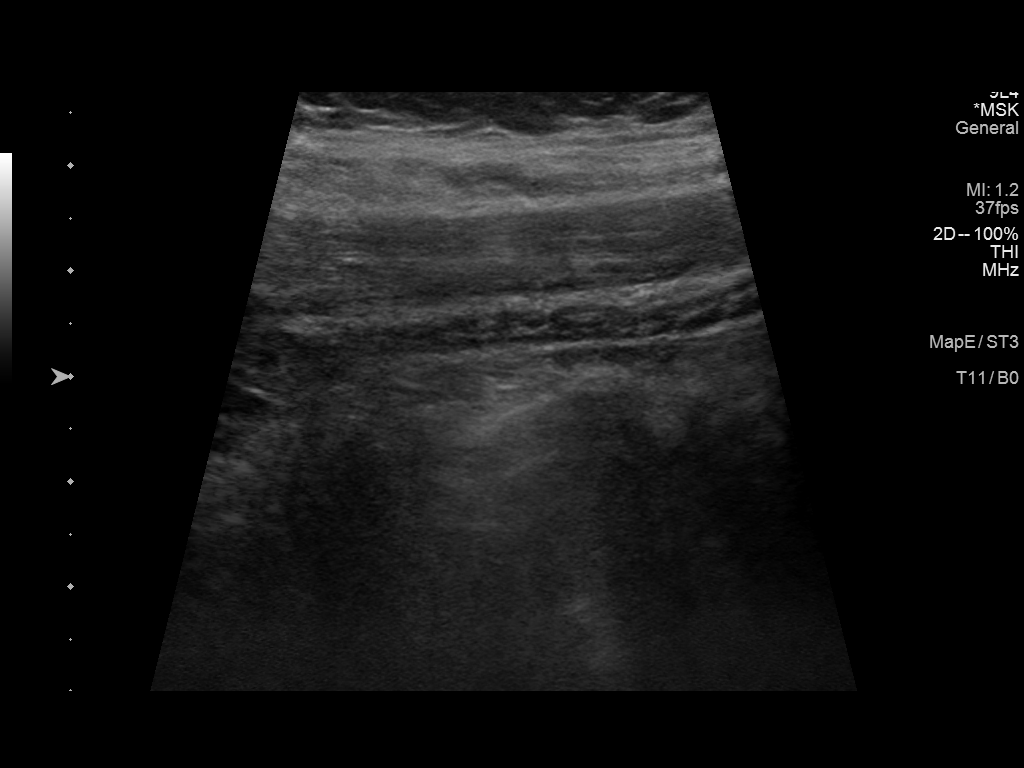
[im 3/5]
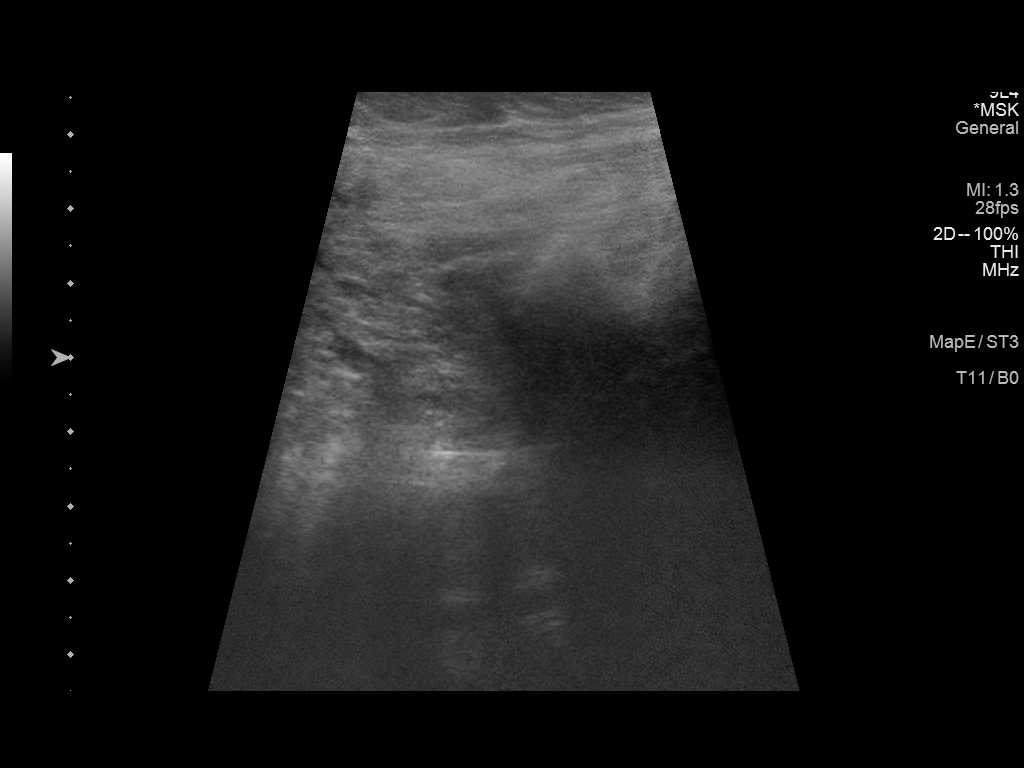
[im 4/5]
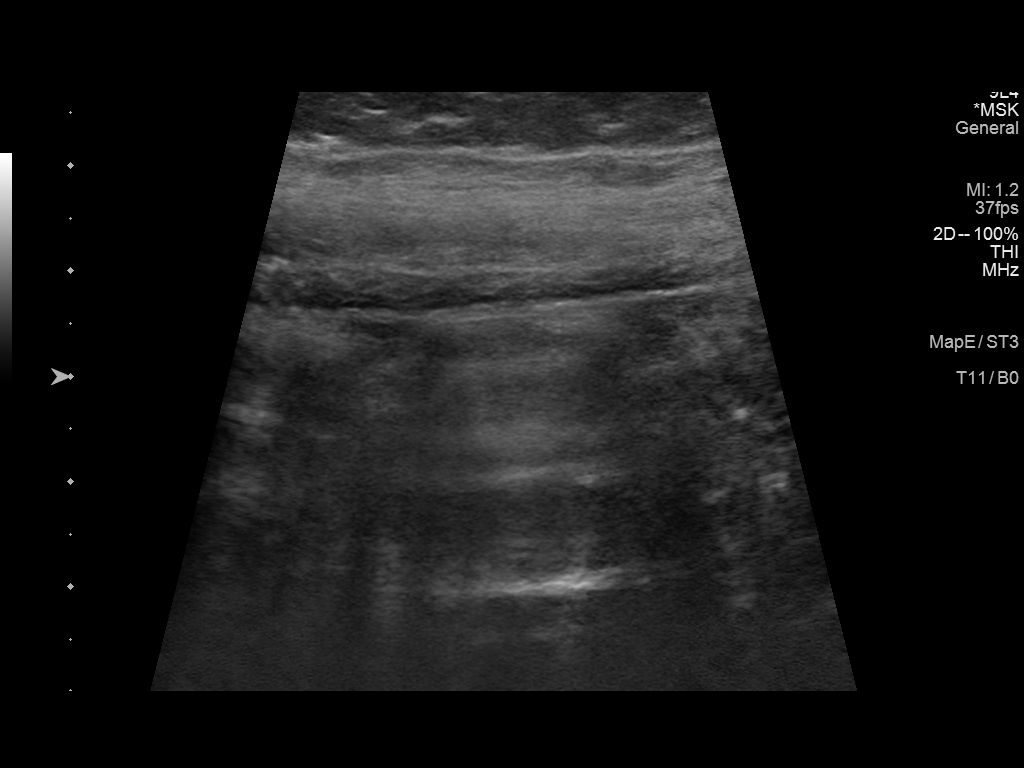
[im 5/5]
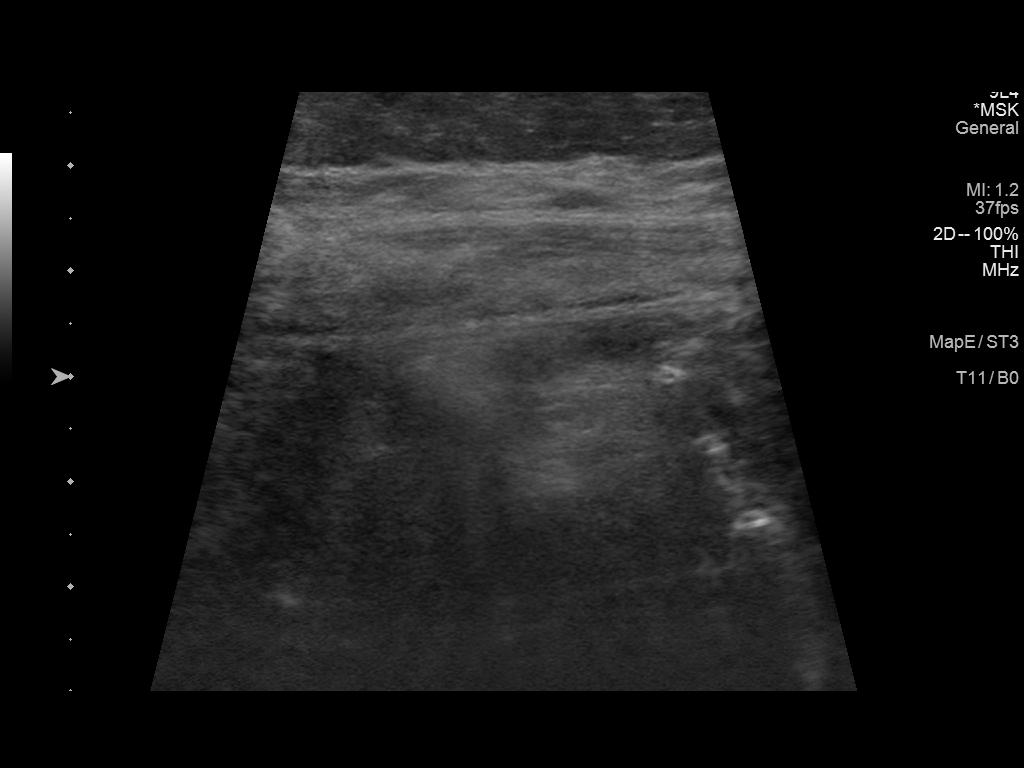

[5 of 5 positions shown; findings below may reference images not displayed]

FINDINGS: Scanning in the area of clinical concern reveals no subcutaneous
mass lesion.
IMPRESSION: No mass lesion identified.

## 2020-02-13 ENCOUNTER — Other Ambulatory Visit: Payer: Self-pay | Admitting: Family Medicine

## 2020-02-13 DIAGNOSIS — F411 Generalized anxiety disorder: Secondary | ICD-10-CM

## 2020-02-13 DIAGNOSIS — F324 Major depressive disorder, single episode, in partial remission: Secondary | ICD-10-CM

## 2020-02-13 NOTE — Telephone Encounter (Signed)
Left message for patient to call back. Due for a follow up. LOV was in April 2020

## 2020-02-14 NOTE — Telephone Encounter (Signed)
Left message on both numbers to call back

## 2020-02-17 NOTE — Telephone Encounter (Signed)
Letter mailed to patient about this

## 2020-05-28 ENCOUNTER — Other Ambulatory Visit: Payer: Self-pay

## 2020-05-28 ENCOUNTER — Ambulatory Visit (INDEPENDENT_AMBULATORY_CARE_PROVIDER_SITE_OTHER): Payer: BC Managed Care – PPO | Admitting: Family Medicine

## 2020-05-28 ENCOUNTER — Encounter: Payer: Self-pay | Admitting: Family Medicine

## 2020-05-28 VITALS — BP 98/60 | HR 70 | Temp 97.9°F | Ht 65.5 in | Wt 144.5 lb

## 2020-05-28 DIAGNOSIS — M7989 Other specified soft tissue disorders: Secondary | ICD-10-CM | POA: Diagnosis not present

## 2020-05-28 DIAGNOSIS — Z131 Encounter for screening for diabetes mellitus: Secondary | ICD-10-CM

## 2020-05-28 DIAGNOSIS — Z1159 Encounter for screening for other viral diseases: Secondary | ICD-10-CM

## 2020-05-28 DIAGNOSIS — Z13220 Encounter for screening for lipoid disorders: Secondary | ICD-10-CM

## 2020-05-28 DIAGNOSIS — Z0000 Encounter for general adult medical examination without abnormal findings: Secondary | ICD-10-CM

## 2020-05-28 DIAGNOSIS — J309 Allergic rhinitis, unspecified: Secondary | ICD-10-CM

## 2020-05-28 MED ORDER — FLUTICASONE PROPIONATE 50 MCG/ACT NA SUSP: 1.0000 | NASAL | 3 refills | Status: AC | PRN

## 2020-05-28 NOTE — Progress Notes (Signed)
Annual Exam   Chief Complaint:  Chief Complaint  Patient presents with  . Annual Exam    History of Present Illness:  Ms. Tricia [name] is a 44 y.o. [id number] who LMP was No LMP recorded. (Menstrual status: IUD)., presents today for her annual examination.    #swelling - improves overnight - low bp, rare lightheadedness   Nutrition Diet: food, leftovers for lunch, veggies with lunch/dinner, fruits, does drink soda, tries to drink water, skips breakfast Exercise: walking at school - occasional walks at home She does get adequate calcium and Vitamin D in her diet.   Social History   Tobacco Use  Smoking Status Never Smoker  Smokeless Tobacco Never Used   Social History   Substance and Sexual Activity  Alcohol Use Yes   Comment: about once a week, 1-2 glasses   Social History   Substance and Sexual Activity  Drug Use No    Safety The patient wears seatbelts: yes.     The patient feels safe at home and in their relationships: yes.  General Health Dentist in the last year: Yes Eye doctor: yes  Menstrual mirena in place - did have two short cycles after covid vaccine No cramping  Endorses some hot symptoms   GYN She is single partner, contraception - IUD.    Cervical Cancer Screening:   Last Pap:   October 2019 Results were: no abnormalities HPV DNA not done Repeat Oct 2022  Breast Cancer Screening There is no FH of breast cancer. There is no FH of ovarian cancer. BRCA screening Not Indicated.  Discussed that for average risk women between age 38-49 screening may reduce the risk of breast cancer death, however, at a lower rate than those over age 73. And that the the false-positive rates resulting in unnecessary biopsies with more screening is higher. The balance of benefits vs harms likely improves as you progress through your 40s. The patient does want a mammogram this year.   Weight Wt Readings from Last 3 Encounters:  05/28/20 144 lb 8 oz (65.5 kg)   11/27/18 147 lb 8 oz (66.9 kg)  03/04/12 176 lb (79.8 kg)   Patient has normal BMI  BMI Readings from Last 1 Encounters:  05/28/20 23.68 kg/m     Chronic disease screening Blood pressure monitoring:  BP Readings from Last 3 Encounters:  05/28/20 98/60  11/27/18 100/62  11/13/18 117/80    Lipid Monitoring: Indication for screening: age >35, obesity, diabetes, family hx, CV risk factors.  Lipid screening: Yes  No results found for: CHOL, HDL, LDLCALC, LDLDIRECT, TRIG, CHOLHDL   Diabetes Screening: age >28, overweight, family hx, PCOS, hx of gestational diabetes, at risk ethnicity Diabetes Screening screening: Yes  No results found for: HGBA1C   Past Medical History:  Diagnosis Date  . Abnormal Pap smear 1999  . Allergy   . Depression   . GERD (gastroesophageal reflux disease)   . History of chicken pox   . Migraine   . Migraines   . PP SVD 03/05/12 M 03/06/2012    Past Surgical History:  Procedure Laterality Date  . DILATION AND CURETTAGE OF UTERUS  2005    Prior to Admission medications   Medication Sig Start Date End Date Taking? Authorizing Provider  acetaminophen (TYLENOL) 500 MG tablet Take 1,000 mg by mouth every 6 (six) hours as needed. pain    [provider]  cyanocobalamin 1000 MCG tablet Take 1,000 mcg by mouth daily.    [provider]  escitalopram (LEXAPRO) 10 MG tablet Take 1 tablet (10 mg total) by mouth daily. Need to schedule an appointment for further refills 02/13/20   Lesleigh Noe, MD  fluticasone [facility]) 50 MCG/ACT nasal spray PLACE 1 SPRAY INTO BOTH NOSTRILS DAILY. 01/22/19   Lesleigh Noe, MD  levonorgestrel (MIRENA) 20 MCG/24HR IUD 1 each by Intrauterine route once.    [provider]    Allergies  Allergen Reactions  . Sulfa Antibiotics Hives and Rash    Gynecologic History: No LMP recorded. (Menstrual status: IUD).  Obstetric History: [id number]  Social History   Socioeconomic History  . Marital  status: Divorced    Spouse name: Not on file  . Number of children: 2  . Years of education: masters degree  . Highest education level: Not on file  Occupational History  . Not on file  Tobacco Use  . Smoking status: Never Smoker  . Smokeless tobacco: Never Used  Vaping Use  . Vaping Use: Never used  Substance and Sexual Activity  . Alcohol use: Yes    Comment: about once a week, 1-2 glasses  . Drug use: No  . Sexual activity: Yes    Birth control/protection: I.U.D.  Other Topics Concern  . 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 Difficulty of Paying Living Expenses: Not on file  Food Insecurity:   . Worried About Charity fundraiser in the Last Year: Not on file  . Ran Out of Food in the Last Year: Not on file  Transportation Needs:   . Lack of Transportation (Medical): Not on file  . Lack of Transportation (Non-Medical): Not on file  Physical Activity:   . Days of Exercise per Week: Not on file  . Minutes of Exercise per Session: Not on file  Stress:   . Feeling of Stress : Not on file  Social Connections:   . Frequency of Communication with Friends and Family: Not on file  . Frequency of Social Gatherings with Friends and Family: Not on file  . Attends Religious Services: Not on file  . Active Member of Clubs or Organizations: Not on file  . Attends Archivist Meetings: Not on file  . Marital Status: Not on file  Intimate Partner Violence:   . Fear of Current or Ex-Partner: Not on file  . Emotionally Abused: Not on file  . Physically Abused: Not on file  . Sexually Abused: Not on file    Family History  Problem Relation Age  of Onset  . Depression Sister   . Miscarriages / Stillbirths Sister   . Rheum arthritis Sister   . Arthritis Mother   . Other Mother        polio survivor  . Hyperlipidemia Father   . Hypertension Father   . AAA (abdominal aortic aneurysm) Father   . Drug abuse Brother   . Anesthesia problems Neg Hx   . Hypotension Neg Hx   . Malignant hyperthermia Neg Hx   . Pseudochol deficiency Neg Hx     Review of Systems  Constitutional: Negative for chills and fever.  HENT:  Negative for congestion and sore throat.   Eyes: Negative for blurred vision and double vision.  Respiratory: Negative for shortness of breath.   Cardiovascular: Negative for chest pain.  Gastrointestinal: Negative for heartburn, nausea and vomiting.  Genitourinary: Negative.   Musculoskeletal: Negative.  Negative for myalgias.  Skin: Negative for rash.  Neurological: Negative for dizziness and headaches.  Endo/Heme/Allergies: Does not bruise/bleed easily.  Psychiatric/Behavioral: Negative for depression. The patient is not nervous/anxious.      Physical Exam BP 98/60   Pulse 70   Temp 97.9 F (36.6 C) (Temporal)   Ht 5' 5.5" (1.664 m)   Wt 144 lb 8 oz (65.5 kg)   SpO2 100%   BMI 23.68 kg/m    BP Readings from Last 3 Encounters:  05/28/20 98/60  11/27/18 100/62  11/13/18 117/80      Physical Exam Constitutional:      General: She is not in acute distress.    Appearance: She is well-developed. She is not diaphoretic.  HENT:     Head: Normocephalic and atraumatic.     Right Ear: External ear normal.     Left Ear: External ear normal.     Nose: Nose normal.  Eyes:     General: No scleral icterus.    Conjunctiva/sclera: Conjunctivae normal.  Cardiovascular:     Rate and Rhythm: Normal rate and regular rhythm.     Heart sounds: No murmur heard.   Pulmonary:     Effort: Pulmonary effort is normal. No respiratory distress.     Breath sounds: Normal breath sounds. No wheezing.  Abdominal:      General: Bowel sounds are normal. There is no distension.     Palpations: Abdomen is soft. There is no mass.     Tenderness: There is no abdominal tenderness. There is no guarding or rebound.  Musculoskeletal:        General: Normal range of motion.     Cervical back: Neck supple.  Lymphadenopathy:     Cervical: No cervical adenopathy.  Skin:    General: Skin is warm and dry.     Capillary Refill: Capillary refill takes less than 2 seconds.  Neurological:     Mental Status: She is alert and oriented to person, place, and time.     Deep Tendon Reflexes: Reflexes normal.  Psychiatric:        Behavior: Behavior normal.      Results:  PHQ-9:    Office Visit from 05/28/2020 in [place] at [facility] Total Score 5        Assessment: 44 y.o. [id number] female here for routine annual physical examination.  Plan: Problem List Items Addressed This Visit      Respiratory   Allergic rhinitis   Relevant Medications   fluticasone (FLONASE) 50 MCG/ACT nasal spray    Other Visit Diagnoses    Encounter for hepatitis C screening test for low risk patient    -  Primary   Relevant Orders   Hepatitis C antibody   Screening for hyperlipidemia       Relevant Orders   Lipid panel   Screening for diabetes mellitus       Relevant Orders   Hemoglobin A1c   Annual physical exam       Relevant Orders   Comprehensive metabolic panel   Foot swelling       Relevant Orders   Comprehensive metabolic panel      Screening: -- Blood pressure screen normal --  cholesterol screening: will obtain -- Weight screening: normal -- Diabetes Screening: will obtain -- Nutrition: Encouraged healthy diet  The ASCVD Risk score Mikey Bussing DC Jr., et al., 2013) failed to calculate for the following reasons:   Cannot find a previous HDL lab   Cannot find a previous total cholesterol lab  -- Statin therapy for Age 67-75 with CVD risk >7.5%  Psych -- Depression screening (PHQ-9):     Office Visit from 05/28/2020 in [place] at [facility]  PHQ-9 Total Score 5       Safety -- tobacco screening: not using -- alcohol screening:  low-risk usage. -- no evidence of domestic violence or intimate partner violence.   Cancer Screening -- pap smear not collected per ASCCP guidelines -- family history of breast cancer screening: done. not at high risk. -- Mammogram - requested -- Colon cancer (age 47+)-- not indicated  Immunizations Immunization History  Administered Date(s) Administered  . PFIZER SARS-COV-2 Vaccination 11/29/2019, 12/20/2019  . Rho (D) Immune Globulin 03/06/2012    -- flu vaccine - will get one at work -- TDAP q10 years unknown, record requested -- Covid-19 Vaccine up to date   Encouraged healthy diet and exercise. Encouraged regular vision and dental care.   Lesleigh Noe, MD

## 2020-05-28 NOTE — Patient Instructions (Addendum)
Swelling - continue to elevate at night - consider compression socks if bothersome - try to drink more water and less sugary beverages  We will get records for your Tetanus

## 2020-05-29 LAB — COMPREHENSIVE METABOLIC PANEL
ALT: 12 U/L (ref 0–35)
AST: 15 U/L (ref 0–37)
Albumin: 4.4 g/dL (ref 3.5–5.2)
Alkaline Phosphatase: 69 U/L (ref 39–117)
BUN: 9 mg/dL (ref 6–23)
CO2: 28 mEq/L (ref 19–32)
Calcium: 9.3 mg/dL (ref 8.4–10.5)
Chloride: 103 mEq/L (ref 96–112)
Creatinine, Ser: 0.94 mg/dL (ref 0.40–1.20)
GFR: 64.57 mL/min (ref >60.00)
Glucose, Bld: 90 mg/dL (ref 70–99)
Potassium: 4.2 mEq/L (ref 3.5–5.1)
Sodium: 137 mEq/L (ref 135–145)
Total Bilirubin: 0.3 mg/dL (ref 0.2–1.2)
Total Protein: 7.3 g/dL (ref 6.0–8.3)

## 2020-05-29 LAB — LIPID PANEL
Cholesterol: 162 mg/dL (ref 0–200)
HDL: 40.2 mg/dL (ref >39.00)
LDL Cholesterol: 106 mg/dL — ABNORMAL HIGH (ref 0–99)
NonHDL: 121.91
Total CHOL/HDL Ratio: 4
Triglycerides: 81 mg/dL (ref 0.0–149.0)
VLDL: 16.2 mg/dL (ref 0.0–40.0)

## 2020-05-29 LAB — HEPATITIS C ANTIBODY
Hepatitis C Ab: NONREACTIVE
SIGNAL TO CUT-OFF: 0 (ref <1.00)

## 2020-05-29 LAB — HEMOGLOBIN A1C: Hgb A1c MFr Bld: 5.4 % (ref 4.6–6.5)

## 2021-06-12 LAB — COLOGUARD: COLOGUARD: NEGATIVE

## 2022-04-12 ENCOUNTER — Telehealth: Payer: Self-pay

## 2022-04-12 NOTE — Telephone Encounter (Signed)
Patient is calling in stating that she is needing a health examination and a TB test done ASAP for new employment. Next available is not going to be until 8/3, patient asked if she can be worked in for an appointment.

## 2022-04-12 NOTE — Telephone Encounter (Signed)
Will defer to PCP upon her return 

## 2022-04-14 NOTE — Telephone Encounter (Signed)
LVM to call back.

## 2022-04-14 NOTE — Telephone Encounter (Signed)
Can use any open spot for patient.

## 2022-04-26 ENCOUNTER — Ambulatory Visit: Payer: BC Managed Care – PPO | Admitting: Family Medicine

## 2022-04-26 VITALS — BP 90/60 | HR 98 | Temp 97.6°F | Ht 66.0 in | Wt 145.4 lb

## 2022-04-26 DIAGNOSIS — F411 Generalized anxiety disorder: Secondary | ICD-10-CM

## 2022-04-26 DIAGNOSIS — Z111 Encounter for screening for respiratory tuberculosis: Secondary | ICD-10-CM

## 2022-04-26 DIAGNOSIS — F330 Major depressive disorder, recurrent, mild: Secondary | ICD-10-CM | POA: Diagnosis not present

## 2022-04-26 MED ORDER — VENLAFAXINE HCL ER 37.5 MG PO CP24: ORAL_CAPSULE | ORAL | 1 refills | Status: DC

## 2022-04-26 NOTE — Patient Instructions (Signed)
You are going to start a new antidepressant medication.   One of the risks of this medication is increase in suicidal thoughts.   Your suicide Action plan is as follows:  1) Call phillip, mom 2) Call the Suicide Hotline [phone] which is available 24 hours 3) Call the Clinic    The most common side effect is stomach upset. If this happens it means the medication is working. It should get better in 1-3 weeks.   Medication for depression and anxiety often takes 6-8 weeks to have a noticeable difference so stick with it. Also the best way for recovery is taking medication and seeing a therapist -- this is so important.    How to help anxiety and depression  1) Regular Exercise - walking, jogging, cycling, dancing, strength training - aiming for 150 minutes of exercise a week --> Yoga has been shown in research to reduce depression and anxiety -- with even just one hour long session per week  2)  Begin a Mindfulness/Meditation practice -- this can take a little as 3 minutes and is helpful for all kinds of mood issues -- You can find resources in books -- Or you can download apps like  ---- Headspace App  ---- Calm  ---- Insignt Timer ---- Stop, Breathe & Think  # With each of these Apps - you should decline the "start free trial" offer and as you search through the App should be able to access some of their free content. You can also chose to pay for the content if you find one that works well for you.   # Many of them also offer sleep specific content which may help with insomnia  3) Healthy Diet -- Avoid or decrease Caffeine -- Avoid or decrease Alcohol -- Drink plenty of water, have a balanced diet -- Avoid cigarettes and marijuana (as well as other recreational drugs)  4) Find a therapist  -- [facility] is one option. Call [phone] -- Or you can check out www.psychologytoday.com -- you can read bios of therapists and see if they accept insurance --  Check with your insurance to see if you have coverage and who may take your insurance

## 2022-04-26 NOTE — Assessment & Plan Note (Signed)
Worsening anxiety in the setting of several life stressors.  She was previously on Lexapro with libido issues, discussed trying effexor.  If she has side effects we will consider Wellbutrin.

## 2022-04-26 NOTE — Progress Notes (Signed)
Subjective:     Tricia [name] is a 46 y.o. 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 which caused a lot of daily anxiety as he was constantly triggered - change has occurred with custody for kids - they wanted more time with their dad - she had primary - no it will be 5 on and 5 off  - he works 24 hour shifts - and sometimes they don't see him - he does not take their daughter to gym - step mom is the person her x-husband had an affair with - daughter has come back with significant anxiety and mood issues - not eating or sleeping well, invasion of privacy by stepmom and dad - 2 kids - 10 and 17 yo  - when her daughter eventually went back she punished 1 month later  - was told that she was going to have change grade levels at her prior school - to work with same group of kids and she didn't want to do this  Trying to initiate counseling with ex-husband and daughter - 3 weeks to try and get this initiated  Was in therapy for years  Has been on lexapro in the past - had low libido    Review of Systems   Social History   Tobacco Use  Smoking Status Never  Smokeless Tobacco Never        Objective:    BP Readings from Last 3 Encounters:  04/26/22 90/60  05/28/20 98/60  11/27/18 100/62   Wt Readings from Last 3 Encounters:  04/26/22 145 lb 6 oz (65.9 kg)  05/28/20 144 lb 8 oz (65.5 kg)  11/27/18 147 lb 8 oz (66.9 kg)    BP 90/60   Pulse 98   Temp 97.6 F (36.4 C) (Temporal)   Ht 5\' 6"  (1.676 m)   Wt 145 lb 6 oz (65.9 kg)   SpO2 99%   Breastfeeding No   BMI 23.46 kg/m    Physical Exam Constitutional:      General: She is not in acute distress.    Appearance: She is well-developed. She is not diaphoretic.  HENT:     Right Ear: External ear normal.     Left Ear: External ear normal.     Nose: Nose normal.  Eyes:     Conjunctiva/sclera: Conjunctivae normal.   Cardiovascular:     Rate and Rhythm: Normal rate and regular rhythm.     Heart sounds: No murmur heard. Pulmonary:     Effort: Pulmonary effort is normal. No respiratory distress.     Breath sounds: Normal breath sounds. No wheezing.  Musculoskeletal:     Cervical back: Neck supple.  Skin:    General: Skin is warm and dry.     Capillary Refill: Capillary refill takes less than 2 seconds.  Neurological:     Mental Status: She is alert. Mental status is at baseline.  Psychiatric:        Mood and Affect: Mood normal.        Behavior: Behavior normal.         04/26/2022   10:50 AM  GAD 7 : Generalized Anxiety Score  Nervous, Anxious, on Edge 2  Control/stop worrying 2  Worry too much - different things 2  Trouble relaxing 1  Restless 0  Easily annoyed or irritable 1  Afraid - awful might happen 1  Total GAD  7 Score 9  Anxiety Difficulty Somewhat difficult       04/26/2022   10:49 AM 05/28/2020    4:58 PM 01/17/2019   10:45 AM  Depression screen PHQ 2/9  Decreased Interest 1 1 0  Down, Depressed, Hopeless 2 1 0  PHQ - 2 Score 3 2 0  Altered sleeping 1 0 1  Tired, decreased energy 1 1 1   Change in appetite 1 1 0  Feeling bad or failure about yourself  1 1 0  Trouble concentrating 1 0 0  Moving slowly or fidgety/restless 0 0 0  Suicidal thoughts 0 0 0  PHQ-9 Score 8 5 2   Difficult doing work/chores Somewhat difficult Not difficult at all Not difficult at all         Assessment & Plan:   Problem List Items Addressed This Visit       Other   Major depressive disorder, recurrent (HCC)    Worsening symptoms in the setting of multiple life stressors.  Start Effexor.  Return in 6 weeks.  History of therapy, declined doing today      Relevant Medications   venlafaxine XR (EFFEXOR XR) 37.5 MG 24 hr capsule   Generalized anxiety disorder - Primary    Worsening anxiety in the setting of several life stressors.  She was previously on Lexapro with libido issues,  discussed trying effexor.  If she has side effects we will consider Wellbutrin.      Relevant Medications   venlafaxine XR (EFFEXOR XR) 37.5 MG 24 hr capsule   Other Visit Diagnoses     Encounter for PPD skin test reading       Relevant Orders   TB Skin Test (Completed)      Work paperwork - pt to bring updated vaccination record.   Return in about 6 weeks (around 06/07/2022) for annual.  , MD

## 2022-04-26 NOTE — Assessment & Plan Note (Addendum)
Worsening symptoms in the setting of multiple life stressors.  Start Effexor.  Return in 6 weeks.  History of therapy, declined doing today

## 2022-04-28 ENCOUNTER — Ambulatory Visit (INDEPENDENT_AMBULATORY_CARE_PROVIDER_SITE_OTHER): Payer: BC Managed Care – PPO

## 2022-04-28 DIAGNOSIS — Z23 Encounter for immunization: Secondary | ICD-10-CM

## 2022-04-28 NOTE — Progress Notes (Signed)
Per orders of Dr. Selena Batten, injection of TDAP given by Erby Pian. Patient tolerated injection well.

## 2022-04-29 LAB — TB SKIN TEST
Induration: 0 mm
TB Skin Test: NEGATIVE

## 2022-05-18 ENCOUNTER — Other Ambulatory Visit: Payer: Self-pay | Admitting: Family Medicine

## 2022-05-18 DIAGNOSIS — F411 Generalized anxiety disorder: Secondary | ICD-10-CM

## 2022-06-09 ENCOUNTER — Ambulatory Visit (INDEPENDENT_AMBULATORY_CARE_PROVIDER_SITE_OTHER): Payer: BC Managed Care – PPO | Admitting: Family Medicine

## 2022-06-09 ENCOUNTER — Other Ambulatory Visit: Payer: Self-pay | Admitting: Family Medicine

## 2022-06-09 VITALS — BP 92/60 | HR 61 | Temp 97.3°F | Ht 66.25 in | Wt 139.2 lb

## 2022-06-09 DIAGNOSIS — F330 Major depressive disorder, recurrent, mild: Secondary | ICD-10-CM | POA: Diagnosis not present

## 2022-06-09 DIAGNOSIS — F411 Generalized anxiety disorder: Secondary | ICD-10-CM | POA: Diagnosis not present

## 2022-06-09 DIAGNOSIS — Z0000 Encounter for general adult medical examination without abnormal findings: Secondary | ICD-10-CM | POA: Diagnosis not present

## 2022-06-09 DIAGNOSIS — N921 Excessive and frequent menstruation with irregular cycle: Secondary | ICD-10-CM

## 2022-06-09 DIAGNOSIS — E782 Mixed hyperlipidemia: Secondary | ICD-10-CM

## 2022-06-09 DIAGNOSIS — Z23 Encounter for immunization: Secondary | ICD-10-CM

## 2022-06-09 DIAGNOSIS — E559 Vitamin D deficiency, unspecified: Secondary | ICD-10-CM

## 2022-06-09 LAB — CBC
HCT: 39.7 % (ref 36.0–46.0)
Hemoglobin: 13.3 g/dL (ref 12.0–15.0)
MCHC: 33.5 g/dL (ref 30.0–36.0)
MCV: 85.3 fl (ref 78.0–100.0)
Platelets: 215 10*3/uL (ref 150.0–400.0)
RBC: 4.66 Mil/uL (ref 3.87–5.11)
RDW: 12.9 % (ref 11.5–15.5)
WBC: 5.9 10*3/uL (ref 4.0–10.5)

## 2022-06-09 LAB — LIPID PANEL
Cholesterol: 168 mg/dL (ref 0–200)
HDL: 45.9 mg/dL (ref >39.00)
LDL Cholesterol: 108 mg/dL — ABNORMAL HIGH (ref 0–99)
NonHDL: 122.16
Total CHOL/HDL Ratio: 4
Triglycerides: 72 mg/dL (ref 0.0–149.0)
VLDL: 14.4 mg/dL (ref 0.0–40.0)

## 2022-06-09 LAB — COMPREHENSIVE METABOLIC PANEL
ALT: 13 U/L (ref 0–35)
AST: 16 U/L (ref 0–37)
Albumin: 4.2 g/dL (ref 3.5–5.2)
Alkaline Phosphatase: 59 U/L (ref 39–117)
BUN: 7 mg/dL (ref 6–23)
CO2: 29 mEq/L (ref 19–32)
Calcium: 9.1 mg/dL (ref 8.4–10.5)
Chloride: 101 mEq/L (ref 96–112)
Creatinine, Ser: 0.81 mg/dL (ref 0.40–1.20)
GFR: 87.04 mL/min (ref >60.00)
Glucose, Bld: 82 mg/dL (ref 70–99)
Potassium: 3.8 mEq/L (ref 3.5–5.1)
Sodium: 136 mEq/L (ref 135–145)
Total Bilirubin: 0.5 mg/dL (ref 0.2–1.2)
Total Protein: 7.1 g/dL (ref 6.0–8.3)

## 2022-06-09 LAB — FERRITIN: Ferritin: 86.5 ng/mL (ref 10.0–291.0)

## 2022-06-09 LAB — TSH: TSH: 1.3 u[IU]/mL (ref 0.35–5.50)

## 2022-06-09 LAB — VITAMIN D 25 HYDROXY (VIT D DEFICIENCY, FRACTURES): VITD: 28.7 ng/mL — ABNORMAL LOW (ref 30.00–100.00)

## 2022-06-09 MED ORDER — VITAMIN D (ERGOCALCIFEROL) 1.25 MG (50000 UNIT) PO CAPS: 50000.0000 [IU] | ORAL_CAPSULE | ORAL | 1 refills | Status: DC

## 2022-06-09 MED ORDER — VENLAFAXINE HCL ER 75 MG PO CP24: 75.0000 mg | ORAL_CAPSULE | Freq: Every day | ORAL | 0 refills | Status: DC

## 2022-06-09 NOTE — Assessment & Plan Note (Signed)
Improved on Effexor.  Continue 75 mg.  She does have some bad days, at this time she does not want to increase but will update if she decides she would like to try 150 mg.

## 2022-06-09 NOTE — Patient Instructions (Signed)
Continue medication Update if you want to increase the dose

## 2022-06-09 NOTE — Progress Notes (Signed)
Annual Exam   Chief Complaint:  Chief Complaint  Patient presents with   Annual Exam    No concerns. Req. Records from [facility]    History of Present Illness:  Ms. Tricia [name] is a 46 y.o. [id number] who LMP was No LMP recorded. (Menstrual status: IUD)., presents today for her annual examination.    Anxiety - doing 75 mg of effexor - some days are harder but overall doing ok  Nutrition She does get adequate calcium and Vitamin D in her diet. Diet: generally healthy Exercise: walking - at work    Social History   Tobacco Use  Smoking Status Never  Smokeless Tobacco Never   Social History   Substance and Sexual Activity  Alcohol Use Yes   Comment: about once a week, 1-2 glasses   Social History   Substance and Sexual Activity  Drug Use No     General Health Dentist in the last year: Yes Eye doctor: no  Safety The patient wears seatbelts: yes.     The patient feels safe at home and in their relationships: yes.   Menstrual:  Symptoms of menopause: starting to have some hot flashes Breakthrough bleeding on IUD  GYN She is single partner, contraception - IUD.   Cervical Cancer Screening (21-65):   Last Pap:   2023 normal  Breast Cancer Screening (Age 72-74):  There is no FH of breast cancer. There is no FH of ovarian cancer. BRCA screening Not Indicated.  Last Mammogram: 2023 The patient does want a mammogram this year.    Colon Cancer Screening:  Age 43-75 yo - benefits outweigh the risk. Adults 70-85 yo who have never been screened benefit.  Benefits: 134000 people in 2016 will be diagnosed and 49,000 will die - early detection helps [place]ms: Complications 2/2 to colonoscopy High Risk (Colonoscopy): genetic disorder (Lynch syndrome or familial adenomatous polyposis), personal hx of IBD, previous adenomatous polyp, or previous colorectal cancer, FamHx start 10 years before the age at diagnosis, increased in males and black race  Options:  FIT -  looks for hemoglobin (blood in the stool) - specific and fairly sensitive - must be done annually Cologuard - looks for DNA and blood - more sensitive - therefore can have more false positives, every 3 years Colonoscopy - every 10 years if normal - sedation, bowl prep, must have someone drive you  Shared decision making and the patient had decided to do cologuard due in 2025.   Social History   Tobacco Use  Smoking Status Never  Smokeless Tobacco Never    Lung Cancer Screening (Ages 38-10): not applicable  Weight Wt Readings from Last 3 Encounters:  06/09/22 139 lb 4 oz (63.2 kg)  04/26/22 145 lb 6 oz (65.9 kg)  05/28/20 144 lb 8 oz (65.5 kg)   Patient has normal BMI  BMI Readings from Last 1 Encounters:  06/09/22 22.31 kg/m     Chronic disease screening Blood pressure monitoring:  BP Readings from Last 3 Encounters:  06/09/22 92/60  04/26/22 90/60  05/28/20 98/60    Lipid Monitoring: Indication for screening: age >89, obesity, diabetes, family hx, CV risk factors.  Lipid screening: Yes  Lab Results  Component Value Date   CHOL 162 05/28/2020   HDL 40.20 05/28/2020   LDLCALC 106 (H) 05/28/2020   TRIG 81.0 05/28/2020   CHOLHDL 4 05/28/2020     Diabetes Screening: age >92, overweight, family hx, PCOS, hx of gestational diabetes, at risk ethnicity Diabetes Screening screening:  Yes  Lab Results  Component Value Date   HGBA1C 5.4 05/28/2020     Past Medical History:  Diagnosis Date   Abnormal Pap smear 1999   Allergy    Depression    GERD (gastroesophageal reflux disease)    History of chicken pox    Migraine    Migraines    PP SVD 03/05/12 M 03/06/2012    Past Surgical History:  Procedure Laterality Date   DILATION AND CURETTAGE OF UTERUS  2005    Prior to Admission medications   Medication Sig Start Date End Date Taking? Authorizing Provider  acetaminophen (TYLENOL) 500 MG tablet Take 1,000 mg by mouth every 6 (six) hours as needed. pain   Yes  [provider]  cyanocobalamin 1000 MCG tablet Take 1,000 mcg by mouth daily.   Yes [provider]  fluticasone (FLONASE) 50 MCG/ACT nasal spray Place 1 spray into both nostrils as needed. 05/28/20  Yes Lesleigh Noe, MD  L-Theanine 100 MG CAPS Take 100 mg by mouth daily.   Yes [provider]  levonorgestrel (MIRENA) 20 MCG/24HR IUD 1 each by Intrauterine route once.   Yes [provider]  Turmeric 500 MG CAPS Take 1 capsule by mouth daily.   Yes [provider]  venlafaxine XR (EFFEXOR-XR) 37.5 MG 24 hr capsule TAKE 1 CAPSULE (37.5 MG) DAILY FOR 7 DAYS. THEN INCREASE TO 2 CAPSULE (75 MG). Patient taking differently: Take 75 mg by mouth daily with breakfast. 05/18/22  Yes Lesleigh Noe, MD    Allergies  Allergen Reactions   Sulfa Antibiotics Hives and Rash    Gynecologic History: No LMP recorded. (Menstrual status: IUD).  Obstetric History: [id number]  Social History   Socioeconomic History   Marital status: Divorced    Spouse name: Not on file   Number of children: 2   Years of education: masters degree   Highest education level: Not on file  Occupational History   Not on file  Tobacco Use   Smoking status: Never   Smokeless tobacco: Never  Vaping Use   Vaping Use: Never used  Substance and Sexual Activity   Alcohol use: Yes    Comment: about once a week, 1-2 glasses   Drug use: No   Sexual activity: Yes    Birth control/protection: I.U.D.  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   Diet: not great, tries to cook healthy meals   Social Determinants of Health   Financial Resource Strain: Low Risk  (11/27/2018)   Overall Financial Resource Strain (CARDIA)    Difficulty of Paying Living  Expenses: Not hard at all  Food Insecurity: Not on file  Transportation Needs: Not on file  Physical Activity: Not on file  Stress: Not on file  Social Connections: Not on file  Intimate Partner Violence: Not on file    Family History  Problem Relation Age of Onset   Depression Sister    Miscarriages / [place] Sister    Rheum arthritis Sister    Arthritis Mother    Other Mother        polio survivor   Hyperlipidemia Father    Hypertension Father    AAA (abdominal aortic aneurysm) Father  Drug abuse Brother    Anesthesia problems Neg Hx    Hypotension Neg Hx    Malignant hyperthermia Neg Hx    Pseudochol deficiency Neg Hx     Review of Systems  Constitutional:  Negative for chills and fever.  HENT:  Negative for congestion and sore throat.   Eyes:  Negative for blurred vision and double vision.  Respiratory:  Negative for shortness of breath.   Cardiovascular:  Negative for chest pain.  Gastrointestinal:  Negative for heartburn, nausea and vomiting.  Genitourinary: Negative.   Musculoskeletal: Negative.  Negative for myalgias.  Skin:  Negative for rash.  Neurological:  Negative for dizziness and headaches.  Endo/Heme/Allergies:  Does not bruise/bleed easily.  Psychiatric/Behavioral:  Negative for depression. The patient is not nervous/anxious.      Physical Exam BP 92/60   Pulse 61   Temp (!) 97.3 F (36.3 C) (Temporal)   Ht 5' 6.25" (1.683 m)   Wt 139 lb 4 oz (63.2 kg)   SpO2 100%   BMI 22.31 kg/m    BP Readings from Last 3 Encounters:  06/09/22 92/60  04/26/22 90/60  05/28/20 98/60      Physical Exam Constitutional:      General: She is not in acute distress.    Appearance: She is well-developed. She is not diaphoretic.  HENT:     Head: Normocephalic and atraumatic.     Right Ear: External ear normal.     Left Ear: External ear normal.     Nose: Nose normal.  Eyes:     General: No scleral icterus.    Extraocular Movements: Extraocular  movements intact.     Conjunctiva/sclera: Conjunctivae normal.  Cardiovascular:     Rate and Rhythm: Normal rate and regular rhythm.     Heart sounds: No murmur heard. Pulmonary:     Effort: Pulmonary effort is normal. No respiratory distress.     Breath sounds: Normal breath sounds. No wheezing.  Abdominal:     General: Bowel sounds are normal. There is no distension.     Palpations: Abdomen is soft. There is no mass.     Tenderness: There is no abdominal tenderness. There is no guarding or rebound.  Musculoskeletal:        General: Normal range of motion.     Cervical back: Neck supple.  Lymphadenopathy:     Cervical: No cervical adenopathy.  Skin:    General: Skin is warm and dry.     Capillary Refill: Capillary refill takes less than 2 seconds.  Neurological:     Mental Status: She is alert and oriented to person, place, and time.     Deep Tendon Reflexes: Reflexes normal.  Psychiatric:        Mood and Affect: Mood normal.        Behavior: Behavior normal.     Results:  PHQ-9:  [facility] Visit from 04/26/2022 in [place] at [place]  PHQ-9 Total Score 8         06/09/2022    2:16 PM 04/26/2022   10:50 AM  GAD 7 : Generalized Anxiety Score  Nervous, Anxious, on Edge 0 2  Control/stop worrying 0 2  Worry too much - different things 0 2  Trouble relaxing 0 1  Restless 0 0  Easily annoyed or irritable 0 1  Afraid - awful might happen 0 1  Total GAD 7 Score 0 9  Anxiety Difficulty  Somewhat difficult  Assessment: 46 y.o. [id number] female here for routine annual physical examination.  Plan: Problem List Items Addressed This Visit       Other   Major depressive disorder, recurrent (HCC)   Relevant Medications   venlafaxine XR (EFFEXOR-XR) 75 MG 24 hr capsule   Other Relevant Orders   Vitamin D, 25-hydroxy   TSH   CBC   Ferritin   Generalized anxiety disorder    Improved on Effexor.  Continue 75 mg.  She does have some bad  days, at this time she does not want to increase but will update if she decides she would like to try 150 mg.      Relevant Medications   venlafaxine XR (EFFEXOR-XR) 75 MG 24 hr capsule   Other Relevant Orders   Vitamin D, 25-hydroxy   TSH   CBC   Ferritin   Other Visit Diagnoses     Annual physical exam    -  Primary   Relevant Orders   Comprehensive metabolic panel   Lipid panel   Need for influenza vaccination       Relevant Orders   Flu Vaccine QUAD 4moIM (Fluarix, Fluzone & Alfiuria Quad PF) (Completed)   Breakthrough bleeding       Relevant Orders   TSH   CBC   Ferritin   Mixed hyperlipidemia       Relevant Orders   Lipid panel       Screening: -- Blood pressure screen normal -- cholesterol screening: will obtain -- Weight screening: normal -- Diabetes Screening: will obtain -- Nutrition: Encouraged healthy diet  The 10-year ASCVD risk score (Arnett DK, et al., 2019) is: 0.5%   Values used to calculate the score:     Age: [age]years     Sex: Female     Is Non-Hispanic African American: No     Diabetic: No     Tobacco smoker: No     Systolic Blood Pressure: 92 mmHg     Is BP treated: No     HDL Cholesterol: 40.2 mg/dL     Total Cholesterol: 162 mg/dL  -- Statin therapy for Age 46-75with CVD risk >7.5%  Psych -- Depression screening (PHQ-9):  [facility] [place] Total Score 8        Safety -- tobacco screening: not using -- alcohol screening:  low-risk usage. -- no evidence of domestic violence or intimate partner violence.   Cancer Screening -- pap smear not collected per ASCCP guidelines -- family history of breast cancer screening: done. not at high risk. -- Mammogram - requested -- Colon cancer (age 899+--  up to date  Immunizations Immunization History  Administered Date(s) Administered   Influenza,inj,Quad PF,6+ Mos 06/09/2022   PFIZER(Purple Top)SARS-COV-2  Vaccination 11/29/2019, 12/20/2019   PPD Test 04/26/2022   Rho (D) Immune Globulin 03/06/2012   Tdap 04/28/2022    -- flu vaccine up to date -- TDAP q10 years up to date -- -- Covid-19 Vaccine up to date   Encouraged healthy diet and exercise. Encouraged regular vision and dental care.    JLesleigh Noe MD

## 2022-08-26 ENCOUNTER — Other Ambulatory Visit: Payer: Self-pay

## 2022-08-26 DIAGNOSIS — F411 Generalized anxiety disorder: Secondary | ICD-10-CM

## 2022-08-26 MED ORDER — VENLAFAXINE HCL ER 75 MG PO CP24: 75.0000 mg | ORAL_CAPSULE | Freq: Every day | ORAL | 0 refills | Status: DC

## 2022-09-08 ENCOUNTER — Encounter: Payer: Self-pay | Admitting: Internal Medicine

## 2022-09-08 ENCOUNTER — Ambulatory Visit: Payer: BC Managed Care – PPO | Admitting: Internal Medicine

## 2022-09-08 VITALS — BP 100/60 | HR 76 | Temp 97.8°F | Ht 66.25 in | Wt 136.0 lb

## 2022-09-08 DIAGNOSIS — F39 Unspecified mood [affective] disorder: Secondary | ICD-10-CM | POA: Diagnosis not present

## 2022-09-08 NOTE — Assessment & Plan Note (Signed)
Long standing hormonal irritability (better with mirena) and reactive depression Doing well on the venlafaxine 75mg  daily Discussed that we will increase dose if the effect wanes

## 2022-09-08 NOTE — Progress Notes (Signed)
Subjective:    Patient ID: Tricia [name], female    DOB: [birth year], 46 y.o.   MRN: [id number]  HPI Here for transfer of care  Doing well  Initial mood issues went back to start of menses Irritable and horrible premenstrual stuff No mania OCPs helped then but couldn't take it  Satisfied with the venlafaxine Helps with her energy Appetite is okay---down on the medication (lost 10#)  Sister with MDD and suicidal ideation (has been admitted) Paternal cousin bipolar  Only recently had non hormonal issues ---all reactive Marriage break up (he had affair), tough with 2 daughters, custody issues, etc  Current Outpatient Medications on File Prior to Visit  Medication Sig Dispense Refill   acetaminophen (TYLENOL) 500 MG tablet Take 1,000 mg by mouth every 6 (six) hours as needed. pain     Cholecalciferol (VITAMIN D3) 250 MCG (10000 UT) TABS Take by mouth.     cyanocobalamin 1000 MCG tablet Take 1,000 mcg by mouth daily.     fluticasone (FLONASE) 50 MCG/ACT nasal spray Place 1 spray into both nostrils as needed. 15.8 mL 3   L-Theanine 100 MG CAPS Take 100 mg by mouth daily.     levonorgestrel (MIRENA) 20 MCG/24HR IUD 1 each by Intrauterine route once.     Turmeric 500 MG CAPS Take 1 capsule by mouth daily.     venlafaxine XR (EFFEXOR-XR) 75 MG 24 hr capsule Take 1 capsule (75 mg total) by mouth daily with breakfast. 90 capsule 0   No current facility-administered medications on file prior to visit.    Allergies  Allergen Reactions   Sulfa Antibiotics Hives and Rash    Past Medical History:  Diagnosis Date   Abnormal Pap smear 1999   Allergy    Depression    GERD (gastroesophageal reflux disease)    History of chicken pox    Migraine    Migraines    PP SVD 03/05/12 M 03/06/2012    Past Surgical History:  Procedure Laterality Date   DILATION AND CURETTAGE OF UTERUS  2005    Family History  Problem Relation Age of Onset   Depression Sister    Miscarriages / [place]  Sister    Rheum arthritis Sister    Arthritis Mother    Other Mother        polio survivor   Hyperlipidemia Father    Hypertension Father    AAA (abdominal aortic aneurysm) Father    Drug abuse Brother    Anesthesia problems Neg Hx    Hypotension Neg Hx    Malignant hyperthermia Neg Hx    Pseudochol deficiency Neg Hx     Social History   Socioeconomic History   Marital status: Divorced    Spouse name: Not on file   Number of children: 2   Years of education: masters degree   Highest education level: Not on file  Occupational History   Not on file  Tobacco Use   Smoking status: Never    Passive exposure: Never   Smokeless tobacco: Never  Vaping Use   Vaping Use: Never used  Substance and Sexual Activity   Alcohol use: Yes    Comment: about once a week, 1-2 glasses   Drug use: No   Sexual activity: Yes    Birth control/protection: I.U.D.  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   Diet: not great, tries to cook healthy meals   Social Determinants of Health   Financial Resource Strain: Low Risk  (11/27/2018)   Overall Financial Resource Strain (CARDIA)    Difficulty of Paying Living Expenses: Not hard at all  Food Insecurity: Not on file  Transportation Needs: Not on file  Physical Activity: Not on file  Stress: Not on file  Social Connections: Not on file  Intimate Partner Violence: Not on file   Review of Systems Migraines with estrogen Sleeps okay--feels B12 helps that Also on vitamin D    Objective:   Physical Exam Constitutional:      Appearance: Normal appearance.  Neurological:     Mental Status: She is alert.  Psychiatric:        Mood and Affect: Mood normal.        Behavior: Behavior normal.             Assessment & Plan:

## 2023-02-07 ENCOUNTER — Encounter: Payer: Self-pay | Admitting: Internal Medicine

## 2023-02-07 DIAGNOSIS — F411 Generalized anxiety disorder: Secondary | ICD-10-CM

## 2023-02-07 MED ORDER — VENLAFAXINE HCL ER 75 MG PO CP24: 75.0000 mg | ORAL_CAPSULE | Freq: Every day | ORAL | 1 refills | Status: DC

## 2023-02-07 NOTE — Addendum Note (Signed)
Addended by: Eual Fines on: 02/07/2023 08:02 AM   Modules accepted: Orders

## 2023-05-18 ENCOUNTER — Encounter (INDEPENDENT_AMBULATORY_CARE_PROVIDER_SITE_OTHER): Payer: Self-pay

## 2023-05-19 ENCOUNTER — Encounter: Payer: Self-pay | Admitting: Internal Medicine

## 2023-05-19 DIAGNOSIS — F411 Generalized anxiety disorder: Secondary | ICD-10-CM

## 2023-05-19 MED ORDER — VENLAFAXINE HCL ER 75 MG PO CP24: 75.0000 mg | ORAL_CAPSULE | Freq: Every day | ORAL | 0 refills | Status: DC

## 2023-06-12 ENCOUNTER — Ambulatory Visit (INDEPENDENT_AMBULATORY_CARE_PROVIDER_SITE_OTHER): Payer: BC Managed Care – PPO | Admitting: Internal Medicine

## 2023-06-12 ENCOUNTER — Encounter: Payer: Self-pay | Admitting: Internal Medicine

## 2023-06-12 VITALS — BP 106/72 | HR 63 | Temp 97.8°F | Ht 66.0 in | Wt 139.0 lb

## 2023-06-12 DIAGNOSIS — G43009 Migraine without aura, not intractable, without status migrainosus: Secondary | ICD-10-CM | POA: Diagnosis not present

## 2023-06-12 DIAGNOSIS — Z23 Encounter for immunization: Secondary | ICD-10-CM | POA: Diagnosis not present

## 2023-06-12 DIAGNOSIS — F39 Unspecified mood [affective] disorder: Secondary | ICD-10-CM | POA: Diagnosis not present

## 2023-06-12 DIAGNOSIS — Z0000 Encounter for general adult medical examination without abnormal findings: Secondary | ICD-10-CM | POA: Diagnosis not present

## 2023-06-12 MED ORDER — NYSTATIN 100000 UNIT/GM EX POWD: 1.0000 | Freq: Two times a day (BID) | CUTANEOUS | 3 refills | Status: AC | PRN

## 2023-06-12 NOTE — Assessment & Plan Note (Signed)
Mostly hormonal based Feels the mirena helps but also on long standing venlafaxine 75mg  daily

## 2023-06-12 NOTE — Assessment & Plan Note (Signed)
Healthy Had cologard 1-2 years ago--by gyn Yearly mammogram with Dr Billy Coast also Pap per him Discussed exercise Flu vaccine today--COVID update soon

## 2023-06-12 NOTE — Assessment & Plan Note (Signed)
Dry needles for prevention Uses ibuprofen or tylenol to abort

## 2023-06-12 NOTE — Progress Notes (Signed)
Subjective:    Patient ID: Tricia [name], female    DOB: 05-Nov-1975, 47 y.o.   MRN: [id number]  HPI Here for physical  Doing okay Needs left foot checked---had 2 blisters and burning a couple of weeks ago Did mow lawn before this occurred They finally opened after epsom salts soaks Then peroxide Rx---now covered over  Mood okay with venlafaxine Rough at home--daughter is with her now (her decision--apparent trouble with step mother). Now ex- is keeping son (he is angry about things and may be getting misinformation) Usually sleeps okay Uses herb zizyphus----helps her sleep Is in relationship---he had vasectomy (lives with them and her 64 year old niece and daughter). His 2 kids are back and forth with his ex  Current Outpatient Medications on File Prior to Visit  Medication Sig Dispense Refill   acetaminophen (TYLENOL) 500 MG tablet Take 1,000 mg by mouth every 6 (six) hours as needed. pain     Cholecalciferol (VITAMIN D3) 250 MCG (10000 UT) TABS Take 1 tablet by mouth once a week.     cyanocobalamin 1000 MCG tablet Take 1,000 mcg by mouth daily.     fluticasone (FLONASE) 50 MCG/ACT nasal spray Place 1 spray into both nostrils as needed. 15.8 mL 3   L-Theanine 100 MG CAPS Take 100 mg by mouth daily.     levonorgestrel (MIRENA) 20 MCG/24HR IUD 1 each by Intrauterine route once.     Turmeric 500 MG CAPS Take 1 capsule by mouth daily.     venlafaxine XR (EFFEXOR-XR) 75 MG 24 hr capsule Take 1 capsule (75 mg total) by mouth daily with breakfast. 90 capsule 0   No current facility-administered medications on file prior to visit.    Allergies  Allergen Reactions   Sulfa Antibiotics Hives and Rash    Past Medical History:  Diagnosis Date   Abnormal Pap smear 1999   Allergy    Depression    GERD (gastroesophageal reflux disease)    History of chicken pox    Migraine    Migraines    PP SVD 03/05/12 M 03/06/2012    Past Surgical History:  Procedure Laterality Date   DILATION  AND CURETTAGE OF UTERUS  2005    Family History  Problem Relation Age of Onset   Depression Sister    Miscarriages / [place] Sister    Rheum arthritis Sister    Arthritis Mother    Other Mother        polio survivor   Hyperlipidemia Father    Hypertension Father    AAA (abdominal aortic aneurysm) Father    Drug abuse Brother    Anesthesia problems Neg Hx    Hypotension Neg Hx    Malignant hyperthermia Neg Hx    Pseudochol deficiency Neg Hx     Social History   Socioeconomic History   Marital status: Divorced    Spouse name: Not on file   Number of children: 2   Years of education: masters degree   Highest education level: Not on file  Occupational History   Occupation: [employer]    Comment: [place]  Tobacco Use   Smoking status: Never    Passive exposure: Never   Smokeless tobacco: Never  Vaping Use   Vaping status: Never Used  Substance and Sexual Activity   Alcohol use: Yes    Comment: about once a week, 1-2 glasses   Drug use: No   Sexual activity: Yes    Birth control/protection: I.U.D.  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       Walks a lot Wears seat belt  HENT:   Positive for tinnitus. Negative for dental problem and hearing loss.   Eyes:  Negative for visual disturbance.       No diplopia or unilateral vision loss  Respiratory:  Negative for cough, chest tightness and shortness of breath.   Cardiovascular:  Negative for chest pain and leg swelling.       Heart races with stress  Gastrointestinal:  Negative for blood in stool and constipation.       No heartburn   Endocrine: Negative for polydipsia and polyuria.  Genitourinary:  Negative for dyspareunia, dysuria and hematuria.  Musculoskeletal:  Negative for arthralgias, back pain and joint swelling.       Uses tumeric for grinding--knees and hips  Skin:        Some rash in axillae--?from deodorant   Allergic/Immunologic: Positive for environmental allergies. Negative for immunocompromised state.       Uses nasacort in season  Neurological:  Negative for dizziness, syncope and light-headedness.       Migraines are less of an issue Tried to dry needle as preventative Uses analgesics prn  Hematological:  Negative for adenopathy. Does not bruise/bleed easily.  Psychiatric/Behavioral:         Some sleep problems Stress but not depression       Objective:   Physical Exam Constitutional:      Appearance: Normal appearance.  HENT:     Mouth/Throat:     Pharynx: No oropharyngeal exudate or posterior oropharyngeal erythema.  Eyes:     Conjunctiva/sclera: Conjunctivae normal.     Pupils: Pupils are equal, round, and reactive to light.  Cardiovascular:     Rate and Rhythm: Normal rate and regular rhythm.     Pulses: Normal pulses.     Heart sounds: No murmur heard.    No gallop.  Pulmonary:     Effort: Pulmonary effort is normal.     Breath sounds: Normal breath sounds. No wheezing or rales.  Abdominal:     Palpations: Abdomen is soft.     Tenderness: There is no abdominal tenderness.  Musculoskeletal:     Cervical back: Neck supple.     Right lower leg: No edema.     Left lower leg:  No edema.  Lymphadenopathy:     Cervical: No cervical adenopathy.  Skin:    Comments: Slight redness in axilla  Neurological:     General: No focal deficit present.     Mental Status: She is alert and oriented to person, place, and time.  Psychiatric:  Mood and Affect: Mood normal.        Behavior: Behavior normal.            Assessment & Plan:

## 2023-06-13 LAB — COMPREHENSIVE METABOLIC PANEL
ALT: 13 U/L (ref 0–35)
AST: 14 U/L (ref 0–37)
Albumin: 4.2 g/dL (ref 3.5–5.2)
Alkaline Phosphatase: 68 U/L (ref 39–117)
BUN: 9 mg/dL (ref 6–23)
CO2: 31 meq/L (ref 19–32)
Calcium: 9.1 mg/dL (ref 8.4–10.5)
Chloride: 102 meq/L (ref 96–112)
Creatinine, Ser: 0.89 mg/dL (ref 0.40–1.20)
GFR: 77.19 mL/min (ref >60.00)
Glucose, Bld: 84 mg/dL (ref 70–99)
Potassium: 3.7 meq/L (ref 3.5–5.1)
Sodium: 140 meq/L (ref 135–145)
Total Bilirubin: 0.4 mg/dL (ref 0.2–1.2)
Total Protein: 6.8 g/dL (ref 6.0–8.3)

## 2023-06-13 LAB — CBC
HCT: 41.6 % (ref 36.0–46.0)
Hemoglobin: 13.4 g/dL (ref 12.0–15.0)
MCHC: 32.2 g/dL (ref 30.0–36.0)
MCV: 87.1 fl (ref 78.0–100.0)
Platelets: 259 10*3/uL (ref 150.0–400.0)
RBC: 4.77 Mil/uL (ref 3.87–5.11)
RDW: 12.9 % (ref 11.5–15.5)
WBC: 7.4 10*3/uL (ref 4.0–10.5)

## 2023-11-21 ENCOUNTER — Other Ambulatory Visit: Payer: Self-pay | Admitting: Internal Medicine

## 2023-11-21 DIAGNOSIS — F411 Generalized anxiety disorder: Secondary | ICD-10-CM

## 2023-11-21 MED ORDER — VENLAFAXINE HCL ER 75 MG PO CP24: 75.0000 mg | ORAL_CAPSULE | Freq: Every day | ORAL | 2 refills | Status: DC

## 2024-01-16 LAB — HM MAMMOGRAPHY

## 2024-10-11 ENCOUNTER — Other Ambulatory Visit: Payer: Self-pay | Admitting: Internal Medicine

## 2024-10-11 DIAGNOSIS — F411 Generalized anxiety disorder: Secondary | ICD-10-CM

## 2024-10-11 NOTE — Telephone Encounter (Signed)
 Copied from CRM #[id number]. Topic: Clinical - Prescription Issue >> Oct 11, 2024 11:17 AM Victoria A wrote: Reason for CRM: Patient called for refill for refill for venlafaxine  XR (EFFEXOR -XR) 75 MG 24 hr capsule;Patient said Pharmacy has been calling since Monday for request. Patient has not scheduled TOC appointment as of yet but needs medication. Please assist with medicaiton

## 2024-10-11 NOTE — Telephone Encounter (Unsigned)
 Copied from CRM #[id number]. Topic: Clinical - Medication Refill >> Oct 11, 2024 11:09 AM Victoria A wrote: Medication: venlafaxine  XR (EFFEXOR -XR) 75 MG 24 hr capsule  Has the patient contacted their pharmacy? Yes (Agent: If no, request that the patient contact the pharmacy for the refill. If patient does not wish to contact the pharmacy document the reason why and proceed with request.) (Agent: If yes, when and what did the pharmacy advise?) [place] they sent request on Monday  This is the patient's preferred pharmacy:  [id number] [address], [place] - [address] [place] [address] Phone: [phone] Fax: [phone]  Is this the correct pharmacy for this prescription? Yes If no, delete pharmacy and type the correct one.   Has the prescription been filled recently? No  Is the patient out of the medication? Yes  Has the patient been seen for an appointment in the last year OR does the patient have an upcoming appointment? Yes  Can we respond through MyChart? Yes  Agent: Please be advised that Rx refills may take up to 3 business days. We ask that you follow-up with your pharmacy.

## 2024-10-14 MED ORDER — VENLAFAXINE HCL ER 75 MG PO CP24: 75.0000 mg | ORAL_CAPSULE | Freq: Every day | ORAL | 1 refills | Status: AC

## 2024-10-14 NOTE — Telephone Encounter (Addendum)
 ERx 1st request we received was Friday 1/9
